# Patient Record
Sex: Female | Born: 1960 | Race: White | Hispanic: No | Marital: Married | Smoking: Former smoker
Health system: Southern US, Community
[De-identification: ages and names within clinical notes are randomized; demographics above are authoritative.]

## PROBLEM LIST (undated history)

## (undated) DIAGNOSIS — K635 Polyp of colon: Secondary | ICD-10-CM

## (undated) DIAGNOSIS — K5090 Crohn's disease, unspecified, without complications: Secondary | ICD-10-CM

## (undated) DIAGNOSIS — D649 Anemia, unspecified: Secondary | ICD-10-CM

## (undated) DIAGNOSIS — K8020 Calculus of gallbladder without cholecystitis without obstruction: Secondary | ICD-10-CM

## (undated) DIAGNOSIS — K529 Noninfective gastroenteritis and colitis, unspecified: Secondary | ICD-10-CM

## (undated) DIAGNOSIS — K56609 Unspecified intestinal obstruction, unspecified as to partial versus complete obstruction: Secondary | ICD-10-CM

## (undated) DIAGNOSIS — S22009A Unspecified fracture of unspecified thoracic vertebra, initial encounter for closed fracture: Secondary | ICD-10-CM

## (undated) DIAGNOSIS — K602 Anal fissure, unspecified: Secondary | ICD-10-CM

## (undated) DIAGNOSIS — N200 Calculus of kidney: Secondary | ICD-10-CM

## (undated) HISTORY — PX: CHOLECYSTECTOMY: SHX55

## (undated) HISTORY — DX: Calculus of kidney: N20.0

## (undated) HISTORY — DX: Crohn's disease, unspecified, without complications: K50.90

## (undated) HISTORY — DX: Anal fissure, unspecified: K60.2

## (undated) HISTORY — PX: LITHOTRIPSY: SUR834

## (undated) HISTORY — DX: Calculus of gallbladder without cholecystitis without obstruction: K80.20

## (undated) HISTORY — DX: Unspecified intestinal obstruction, unspecified as to partial versus complete obstruction: K56.609

## (undated) HISTORY — DX: Polyp of colon: K63.5

## (undated) HISTORY — PX: TONSILLECTOMY: SHX5217

## (undated) HISTORY — PX: SMALL INTESTINE SURGERY: SHX150

## (undated) HISTORY — DX: Noninfective gastroenteritis and colitis, unspecified: K52.9

## (undated) HISTORY — DX: Unspecified fracture of unspecified thoracic vertebra, initial encounter for closed fracture: S22.009A

## (undated) HISTORY — DX: Anemia, unspecified: D64.9

## (undated) HISTORY — PX: OTHER SURGICAL HISTORY: SHX169

---

## 2015-04-26 ENCOUNTER — Encounter: Payer: Self-pay | Admitting: Internal Medicine

## 2015-06-23 ENCOUNTER — Encounter: Payer: Self-pay | Admitting: Internal Medicine

## 2015-06-23 ENCOUNTER — Ambulatory Visit (INDEPENDENT_AMBULATORY_CARE_PROVIDER_SITE_OTHER): Payer: 59 | Admitting: Internal Medicine

## 2015-06-23 VITALS — BP 116/76 | HR 100 | Ht 65.75 in | Wt 204.0 lb

## 2015-06-23 DIAGNOSIS — K50012 Crohn's disease of small intestine with intestinal obstruction: Secondary | ICD-10-CM

## 2015-06-23 DIAGNOSIS — R21 Rash and other nonspecific skin eruption: Secondary | ICD-10-CM | POA: Diagnosis not present

## 2015-06-23 DIAGNOSIS — Z9119 Patient's noncompliance with other medical treatment and regimen: Secondary | ICD-10-CM | POA: Diagnosis not present

## 2015-06-23 DIAGNOSIS — R5382 Chronic fatigue, unspecified: Secondary | ICD-10-CM

## 2015-06-23 DIAGNOSIS — Z91199 Patient's noncompliance with other medical treatment and regimen due to unspecified reason: Secondary | ICD-10-CM

## 2015-06-23 MED ORDER — DIPHENOXYLATE-ATROPINE 2.5-0.025 MG PO TABS: 1.0000 | ORAL_TABLET | Freq: Four times a day (QID) | ORAL | Status: DC | PRN

## 2015-06-23 MED ORDER — PREDNISONE 10 MG PO TABS: 40.0000 mg | ORAL_TABLET | Freq: Every day | ORAL | Status: DC

## 2015-06-23 NOTE — Progress Notes (Signed)
Referred by  Subjective:    Patient ID: Bailey Lewis, female    DOB: December 29, 1960, 55 y.o.   MRN: [id number] Cc: Crohn's disease HPI 55 yo ww says she was dx Crohn's early 46's when in [place] - has had Treatment w/ remicade and Humira, small bowel resection x 1, abscess drainage x 1 and repeated bowel obstruction hospitalizations. Last cared for in [place] in [place] since last year. Not on any maintenance Tx. "When I am sick I am a good patient and take medications but when I feel well I don't" Having terrible diarrhea w/ urge incontinence. Makes work in [employer] office difficult. Does not like work environment otherwise also.  Has some slight rectal bleeding. Has had endoscopic evaluations abut not sure when last was. No outside records here yet.  Says 40# weight increase in last year "healthy foods do not agree with me" - diarrhea Allergies  Allergen Reactions  . Asa [Aspirin] Other (See Comments)    Asthmatic  . Reglan [Metoclopramide]    No outpatient prescriptions prior to visit.   No facility-administered medications prior to visit.   Past Medical History  Diagnosis Date  . Crohn disease (HCC)   . Anal fissure   . Anemia   . Colon polyps   . Gallstones   . Kidney stones   . Bowel obstruction [facility])    Past Surgical History  Procedure Laterality Date  . Cholecystectomy    . Small intestine surgery      with appendectomy  . Tonsillectomy    . Lithotripsy    . Abdominal drain tube placed     Social History   Social History  . Marital Status: Single    Spouse Name: N/A  . Number of Children: 4  . 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 Smoking status: Current Every Day Smoker -- 0.25 packs/day for 40 years    Types: Cigarettes  . Smokeless tobacco: Never Used  . Alcohol Use: 0.0 oz/week    0 Standard drinks or equivalent per week     Comment: occasional wine or beer  . Drug Use: No  . Sexual Activity: Not Asked    Other Topics Concern  . None   Social History Narrative   Divorced, has boyfriend   1 son 3 daughtesr - 1984, 15, 57 and 2000   2 caffeine/day   06/23/15   Family History  Problem Relation Age of Onset  . Colon cancer Maternal Grandfather   . Colon polyps Father   . Celiac disease Cousin   . Crohn's disease Cousin     x 2  . Diabetes Paternal Grandmother   . Diabetes Maternal Grandmother        Review of Systems As above, fatigue, allergy sxs, fingertip skin rash x years All other negative    Objective:   Physical Exam  116/76 mmHg  Pulse 100  Ht 5' 5.75" (1.67 m)  Wt 204 lb (92.534 kg)  BMI 33.18 kg/m2@  General:  Well-developed, well-nourished and in no acute distress Eyes:  anicteric. ENT:   Mouth and posterior pharynx free of lesions.  Neck:   supple w/o thyromegaly or mass.  Lungs: Clear to auscultation bilaterally. Heart:  S1S2, no rubs, murmurs, gallops. Abdomen:  soft,  mildlytender, no hepatosplenomegaly, hernia, or mass and BS+. Low midline transverse and vertical scars and laporoscopic sacers Rectal: Small anal tags NL tone small rectocele nontender  no mass - Robin CMA present Lymph:  no cervical or supraclavicular adenopathy. Extremities:   no edema, cyanosis or clubbing Skin   fingertips erythematous scale Neuro:  A&O x 3.  Psych:  talkaltive ,mild flight of idease and tangential speech.   Data Reviewed: Requesting records       Assessment & Plan:   Encounter Diagnoses  Name Primary?  . Crohn's disease of small intestine with intestinal obstruction (HCC) Yes  . Chronic fatigue   . Rash/skin eruption - figers   . Noncompliance with treatment    Seems like a severe exacerbation of Crohn's presumam bly due to lack of ongoing Tx  Not sure waht rash is ? Extra-intestinal manifestation  Records review from [facility] Prednisone 40 mg qd Lomotil 1-2 q 6 prn CBC, CRP, TSH, CMET, quantiferon, Hep B S Ag, vit D CT abd/pelvis w/  contrast  Further plans pending the above  We did discuss she may need colonoscopy and may need biologic Tx she admits her noncompliance problems but is open to idea of using biologics again - could need Ab testing - consider Cimzia   ? Post-resecetion bile salt malabsorp diarrhea - has taken cholestyramine in past she says

## 2015-06-23 NOTE — Patient Instructions (Addendum)
  Your physician has requested that you go to the basement for the lab work before leaving today.   We have sent the following medications to your pharmacy for you to pick up at your convenience: Prednisone, lomotil (faxed to pharmacy)   Follow up with Dr Carlean Purl on July 5th at    [facility] have been scheduled for a CT scan of the abdomen and pelvis at [facility] ([address]---this is in the same building as Press [facility]).   You are scheduled on 06/30/15/17 at 2:00pm. You should arrive 15 minutes prior to your appointment time for registration. Please follow the written instructions below on the day of your exam:  WARNING: IF YOU ARE ALLERGIC TO IODINE/X-RAY DYE, PLEASE NOTIFY RADIOLOGY IMMEDIATELY AT [phone]! YOU WILL BE GIVEN A 13 HOUR PREMEDICATION PREP.  1) Do not eat or drink anything after 10:00am (4 hours prior to your test) 2) You have been given 2 bottles of oral contrast to drink. The solution may taste   better if refrigerated, but do NOT add ice or any other liquid to this solution. Shake   well before drinking.    Drink 1 bottle of contrast @ 12:00pm (2 hours prior to your exam)  Drink 1 bottle of contrast @ 1:00pm (1 hour prior to your exam)  You may take any medications as prescribed with a small amount of water except for the following: Metformin, Glucophage, Glucovance, Avandamet, Riomet, Fortamet, Actoplus Met, Janumet, Glumetza or Metaglip. The above medications must be held the day of the exam AND 48 hours after the exam.  The purpose of you drinking the oral contrast is to aid in the visualization of your intestinal tract. The contrast solution may cause some diarrhea. Before your exam is started, you will be given a small amount of fluid to drink. Depending on your individual set of symptoms, you may also receive an intravenous injection of x-ray contrast/dye. Plan on being at [facility] for 30 minutes or longer, depending on the type of  exam you are having performed.  This test typically takes 30-45 minutes to complete.  If you have any questions regarding your exam or if you need to reschedule, you may call the CT department at [phone] between the hours of 8:00 am and 5:00 pm, Monday-Friday.  ________________________________________________________________________   I appreciate the opportunity to care for you. Silvano Rusk, MD, [facility]

## 2015-06-24 ENCOUNTER — Encounter: Payer: Self-pay | Admitting: Internal Medicine

## 2015-06-24 ENCOUNTER — Other Ambulatory Visit (INDEPENDENT_AMBULATORY_CARE_PROVIDER_SITE_OTHER): Payer: 59

## 2015-06-24 DIAGNOSIS — R5382 Chronic fatigue, unspecified: Secondary | ICD-10-CM | POA: Diagnosis not present

## 2015-06-24 DIAGNOSIS — K50012 Crohn's disease of small intestine with intestinal obstruction: Secondary | ICD-10-CM | POA: Diagnosis not present

## 2015-06-24 LAB — COMPREHENSIVE METABOLIC PANEL
ALBUMIN: 4.2 g/dL (ref 3.5–5.2)
ALK PHOS: 100 U/L (ref 39–117)
ALT: 24 U/L (ref 0–35)
AST: 22 U/L (ref 0–37)
BILIRUBIN TOTAL: 0.9 mg/dL (ref 0.2–1.2)
BUN: 11 mg/dL (ref 6–23)
CO2: 26 mEq/L (ref 19–32)
Calcium: 9.2 mg/dL (ref 8.4–10.5)
Chloride: 106 mEq/L (ref 96–112)
Creatinine, Ser: 0.72 mg/dL (ref 0.40–1.20)
GFR: 89.13 mL/min (ref >60.00)
GLUCOSE: 90 mg/dL (ref 70–99)
POTASSIUM: 4 meq/L (ref 3.5–5.1)
SODIUM: 141 meq/L (ref 135–145)
TOTAL PROTEIN: 7.1 g/dL (ref 6.0–8.3)

## 2015-06-24 LAB — CBC WITH DIFFERENTIAL/PLATELET
BASOS PCT: 0.6 % (ref 0.0–3.0)
Basophils Absolute: 0 10*3/uL (ref 0.0–0.1)
EOS ABS: 0.3 10*3/uL (ref 0.0–0.7)
EOS PCT: 4.2 % (ref 0.0–5.0)
HEMATOCRIT: 42.5 % (ref 36.0–46.0)
HEMOGLOBIN: 14.7 g/dL (ref 12.0–15.0)
LYMPHS PCT: 45.3 % (ref 12.0–46.0)
Lymphs Abs: 3 10*3/uL (ref 0.7–4.0)
MCHC: 34.6 g/dL (ref 30.0–36.0)
MCV: 90.1 fl (ref 78.0–100.0)
MONOS PCT: 6.8 % (ref 3.0–12.0)
Monocytes Absolute: 0.5 10*3/uL (ref 0.1–1.0)
NEUTROS ABS: 2.9 10*3/uL (ref 1.4–7.7)
Neutrophils Relative %: 43.1 % (ref 43.0–77.0)
PLATELETS: 354 10*3/uL (ref 150.0–400.0)
RBC: 4.72 Mil/uL (ref 3.87–5.11)
RDW: 13 % (ref 11.5–15.5)
WBC: 6.7 10*3/uL (ref 4.0–10.5)

## 2015-06-24 LAB — VITAMIN D 25 HYDROXY (VIT D DEFICIENCY, FRACTURES): VITD: 30.3 ng/mL (ref 30.00–100.00)

## 2015-06-24 LAB — TSH: TSH: 1.52 u[IU]/mL (ref 0.35–4.50)

## 2015-06-24 LAB — C-REACTIVE PROTEIN: CRP: 0.1 mg/dL — ABNORMAL LOW (ref 0.5–20.0)

## 2015-06-24 NOTE — Addendum Note (Signed)
Addended by: Iva Boop on: 06/24/2015 06:43 PM   Modules accepted: Level of Service

## 2015-06-24 NOTE — Progress Notes (Signed)
Quick Note:  Labs all look great Await CT and TB/Hep B studies ______

## 2015-06-25 LAB — HEPATITIS B SURFACE ANTIGEN: HEP B S AG: NEGATIVE

## 2015-06-27 LAB — QUANTIFERON TB GOLD ASSAY (BLOOD)
Interferon Gamma Release Assay: NEGATIVE
Mitogen-Nil: 8.97 IU/mL
Quantiferon Nil Value: 0.05 IU/mL
Quantiferon Tb Ag Minus Nil Value: <0 IU/mL

## 2015-06-30 ENCOUNTER — Ambulatory Visit (INDEPENDENT_AMBULATORY_CARE_PROVIDER_SITE_OTHER)
Admission: RE | Admit: 2015-06-30 | Discharge: 2015-06-30 | Disposition: A | Discharge status: Home / Self Care | Payer: 59 | Source: Ambulatory Visit | Attending: Internal Medicine | Admitting: Internal Medicine

## 2015-06-30 DIAGNOSIS — K50012 Crohn's disease of small intestine with intestinal obstruction: Secondary | ICD-10-CM

## 2015-06-30 MED ORDER — IOPAMIDOL (ISOVUE-300) INJECTION 61%
100.0000 mL | Freq: Once | INTRAVENOUS | Status: AC | PRN
Start: 2015-06-30 — End: 2015-06-30
  Administered 2015-06-30: 100 mL via INTRAVENOUS

## 2015-07-13 ENCOUNTER — Ambulatory Visit (INDEPENDENT_AMBULATORY_CARE_PROVIDER_SITE_OTHER): Payer: 59 | Admitting: Internal Medicine

## 2015-07-13 ENCOUNTER — Encounter: Payer: Self-pay | Admitting: Internal Medicine

## 2015-07-13 VITALS — BP 110/78 | HR 96 | Ht 65.75 in | Wt 204.0 lb

## 2015-07-13 DIAGNOSIS — Z9049 Acquired absence of other specified parts of digestive tract: Secondary | ICD-10-CM

## 2015-07-13 DIAGNOSIS — R197 Diarrhea, unspecified: Secondary | ICD-10-CM | POA: Diagnosis not present

## 2015-07-13 DIAGNOSIS — K5000 Crohn's disease of small intestine without complications: Secondary | ICD-10-CM

## 2015-07-13 NOTE — Progress Notes (Signed)
   Subjective:    Patient ID: Bailey Lewis, female    DOB: 11-19-1960, 55 y.o.   MRN: [id number] Cc: f/u diarrhea, hx Crohn's HPI  She is here w/ boyfriend - did not take prednisone or Lomotil. Less diarrhea and cramps. Labs, CT scan ok. ROI from [facility] no colonoscopy done there though she gave that hx. Prior Lanetta Inch use is reported by her today. Medications, allergies, past medical history, past surgical history, family history and social history are reviewed and updated in the EMR.  Review of Systems As above    Objective:   Physical Exam BP 110/78 mmHg  Pulse 96  Ht 5' 5.75" (1.67 m)  Wt 204 lb (92.534 kg)  BMI 33.18 kg/m2 NAD     Assessment & Plan:   Encounter Diagnoses  Name Primary?  . Crohn's disease of small intestine without complication (HCC) Yes  . Diarrhea, unspecified type   . S/P small bowel resection      So far no good evidence for active Crohn's. She needs a colonoscopy - The risks and benefits as well as alternatives of endoscopic procedure(s) have been discussed and reviewed. All questions answered. The patient agrees to proceed.  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  15 minutes time spent with patient > half in counseling coordination of care

## 2015-07-13 NOTE — Patient Instructions (Signed)

## 2015-07-13 NOTE — Progress Notes (Signed)
Quick Note:  CT scan is ok  1) what are sxs now 2) is she still taking 40 mg prednisone qd and also how much Lomotil use 3) needs colonoscopy re: Crohn's and diarrhea ______

## 2015-07-14 ENCOUNTER — Encounter: Payer: Self-pay | Admitting: Internal Medicine

## 2015-07-15 ENCOUNTER — Encounter: Payer: Self-pay | Admitting: Internal Medicine

## 2015-07-28 ENCOUNTER — Encounter: Payer: 59 | Admitting: Internal Medicine

## 2018-08-09 DIAGNOSIS — K56609 Unspecified intestinal obstruction, unspecified as to partial versus complete obstruction: Secondary | ICD-10-CM | POA: Insufficient documentation

## 2019-07-29 DIAGNOSIS — L409 Psoriasis, unspecified: Secondary | ICD-10-CM | POA: Insufficient documentation

## 2020-06-09 ENCOUNTER — Ambulatory Visit
Admission: RE | Admit: 2020-06-09 | Discharge: 2020-06-09 | Disposition: A | Discharge status: Home / Self Care | Payer: 59 | Source: Ambulatory Visit | Attending: Obstetrics and Gynecology | Admitting: Obstetrics and Gynecology

## 2020-06-09 ENCOUNTER — Other Ambulatory Visit: Payer: Self-pay

## 2020-06-09 ENCOUNTER — Other Ambulatory Visit: Payer: Self-pay | Admitting: Obstetrics and Gynecology

## 2020-06-09 DIAGNOSIS — Z1231 Encounter for screening mammogram for malignant neoplasm of breast: Secondary | ICD-10-CM

## 2020-06-30 DIAGNOSIS — Z6832 Body mass index (BMI) 32.0-32.9, adult: Secondary | ICD-10-CM | POA: Diagnosis not present

## 2020-06-30 DIAGNOSIS — Z01419 Encounter for gynecological examination (general) (routine) without abnormal findings: Secondary | ICD-10-CM | POA: Diagnosis not present

## 2020-07-01 DIAGNOSIS — Z1329 Encounter for screening for other suspected endocrine disorder: Secondary | ICD-10-CM | POA: Diagnosis not present

## 2020-07-01 DIAGNOSIS — Z13228 Encounter for screening for other metabolic disorders: Secondary | ICD-10-CM | POA: Diagnosis not present

## 2020-07-01 DIAGNOSIS — Z13220 Encounter for screening for lipoid disorders: Secondary | ICD-10-CM | POA: Diagnosis not present

## 2020-07-01 DIAGNOSIS — Z1321 Encounter for screening for nutritional disorder: Secondary | ICD-10-CM | POA: Diagnosis not present

## 2020-08-05 ENCOUNTER — Encounter (HOSPITAL_BASED_OUTPATIENT_CLINIC_OR_DEPARTMENT_OTHER): Payer: Self-pay | Admitting: Nurse Practitioner

## 2020-08-05 ENCOUNTER — Other Ambulatory Visit: Payer: Self-pay

## 2020-08-05 ENCOUNTER — Ambulatory Visit (HOSPITAL_BASED_OUTPATIENT_CLINIC_OR_DEPARTMENT_OTHER): Payer: 59 | Admitting: Nurse Practitioner

## 2020-08-05 VITALS — BP 129/86 | HR 86 | Ht 66.0 in | Wt 207.2 lb

## 2020-08-05 DIAGNOSIS — Z87891 Personal history of nicotine dependence: Secondary | ICD-10-CM | POA: Diagnosis not present

## 2020-08-05 DIAGNOSIS — E559 Vitamin D deficiency, unspecified: Secondary | ICD-10-CM | POA: Diagnosis not present

## 2020-08-05 DIAGNOSIS — G4709 Other insomnia: Secondary | ICD-10-CM | POA: Diagnosis not present

## 2020-08-05 DIAGNOSIS — L301 Dyshidrosis [pompholyx]: Secondary | ICD-10-CM | POA: Diagnosis not present

## 2020-08-05 DIAGNOSIS — R4589 Other symptoms and signs involving emotional state: Secondary | ICD-10-CM | POA: Diagnosis not present

## 2020-08-05 DIAGNOSIS — Z1211 Encounter for screening for malignant neoplasm of colon: Secondary | ICD-10-CM | POA: Insufficient documentation

## 2020-08-05 DIAGNOSIS — Z13220 Encounter for screening for lipoid disorders: Secondary | ICD-10-CM | POA: Insufficient documentation

## 2020-08-05 DIAGNOSIS — Z7689 Persons encountering health services in other specified circumstances: Secondary | ICD-10-CM

## 2020-08-05 DIAGNOSIS — R635 Abnormal weight gain: Secondary | ICD-10-CM | POA: Diagnosis not present

## 2020-08-05 DIAGNOSIS — K50919 Crohn's disease, unspecified, with unspecified complications: Secondary | ICD-10-CM

## 2020-08-05 MED ORDER — HYDROXYZINE HCL 50 MG PO TABS: 50.0000 mg | ORAL_TABLET | Freq: Every evening | ORAL | 3 refills | Status: DC | PRN

## 2020-08-05 NOTE — Assessment & Plan Note (Signed)
No red flags today Low dose lung CT ordered based on recommendations

## 2020-08-05 NOTE — Assessment & Plan Note (Signed)
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  Recommend trial of hydroxyzine as needed at night for anxiety and sleep May consider SSRI for mood if this is helpful, but not successful in complete control Counseling may be beneficial to help with both sleep, anxiety/depression symptoms, and emotional eating.

## 2020-08-05 NOTE — Assessment & Plan Note (Signed)
Lesions present bilaterally on palms of hands.  No pustules present at this time Patient reports this is well controlled at this time with PRN steroid creams No signs of infection or progression Will monitor

## 2020-08-05 NOTE — Patient Instructions (Addendum)
Recommendations from today's visit: Try the hydroxyzine for sleep I strongly recommend trying journaling your thoughts and progressive muscle relaxation (you can find this on youtube) before bedtime to help with relaxation.  I would like you to work on conscious eating habits and allowing yourself small portions of treats, but only small amounts.  If you find yourself mindlessly snacking, you can provide yourself with a "serving size" in a bowl and once that is gone then stop.  We can always look into medication to help with weight loss if you need it, but right now I think you have a great motivation and are willing to try other things so lets go for it! I recommend taking Vitamin D3 supplement of 1000iU daily to help with your low Vitamin D levels  Information on diet, exercise, and health maintenance recommendations are listed below. This is information to help you be sure you are on track for optimal health and monitoring.    Please look over this and let us know if you have any questions or if you have completed any of the health maintenance outside of [facility] so that we can be sure your records are up to date.  ___________________________________________________________  Thank you for choosing [facility] for your Primary Care needs. I am excited for the opportunity to partner with you to meet your health care goals. It was a pleasure meeting you today!  I am an Adult-Geriatric Nurse Practitioner with a background in caring for patients for more than 20 years. I provide primary care and sports medicine services to patients age 40 and older within this office. I am also the director of the APP Fellowship with [facility].   I am passionate about providing the best service to you through preventive medicine and supportive care. I consider you a part of the medical team and value your input. I work diligently to ensure that you are heard and your needs are  met in a safe and effective manner. I want you to feel comfortable with me as your provider and want you to know that your health concerns are important to me.  For your information, our office hours are Monday- Friday 8:00 AM - 5:00 PM At this time I am not in the office on Wednesdays.  If you have questions or concerns, please call our office at [phone] or send [place] a MyChart message and we will respond as quickly as possible.   For all urgent or time sensitive needs we ask that you please call the office to avoid delays. MyChart is not constantly monitored and replies may take up to 72 business hours.  MyChart Policy: MyChart allows for you to see your visit notes, after visit summary, provider recommendations, lab and tests results, make an appointment, request refills, and contact your provider or the office for non-urgent questions or concerns. Providers are seeing patients during normal business hours and do not have built in time to review MyChart messages.  We ask that you allow a minimum of 4 business days for responses to [employer]. For this reason, please do not send urgent requests through [place]. Please call the office at [phone]. Complex MyChart concerns may require a visit. Your provider may request you schedule a virtual or in person visit to ensure we are providing the best care possible. MyChart messages sent after 4:00 PM on Friday will not be received by the provider until Monday morning.    Lab and Test  Results: You will receive your lab and test results on MyChart as soon as they are completed and results have been sent by the lab or testing facility. Due to this service, you will receive your results BEFORE your provider.  I review lab and tests results each morning prior to seeing patients. Some results require collaboration with other providers to ensure you are receiving the most appropriate care. For this reason, we ask that you please allow a minimum of 4  business days for your provider to receive and review lab and test results and contact you about these.  Most lab and test result comments from the provider will be sent through [place]. Your provider may recommend changes to the plan of care, follow-up visits, repeat testing, ask questions, or request an office visit to discuss these results. You may reply directly to this message or call the office at [phone] to provide information for the provider or set up an appointment. In some instances, you will be called with test results and recommendations. Please let us know if this is preferred and we will make note of this in your chart to provide this for you.    If you have not heard a response to your lab or test results in 72 business hours, please call the office to let us know.   After Hours: For all non-emergency after hours needs, please call the office at [phone] and select the option to reach the on-call provider service. On-call services are shared between multiple [place] offices and therefore it will not be possible to speak directly with your provider. On-call providers may provide medical advice and recommendations, but are unable to provide refills for maintenance medications.  For all emergency or urgent medical needs after normal business hours, we recommend that you seek care at the closest Urgent Care or Emergency Department to ensure appropriate treatment in a timely manner.  [facility] has a 24 hour emergency room located on the ground floor for your convenience.    Please do not hesitate to reach out to [place] with concerns.   Thank you, again, for choosing me as your health care partner. I appreciate your trust and look forward to learning more about you.   Worthy Keeler, DNP, AGNP-c ___________________________________________________________  Health Maintenance Recommendations Screening Testing Mammogram Every 1 -2 years based on history  and risk factors Starting at age 41 Pap Smear Ages 21-39 every 3 years Ages 89-65 every 5 years with HPV testing More frequent testing may be required based on results and history Colon Cancer Screening Every 1-10 years based on test performed, risk factors, and history Starting at age 73 Bone Density Screening Every 2-10 years based on history Starting at age 63 for women Recommendations for men differ based on medication usage, history, and risk factors AAA Screening One time ultrasound Men 51-28 years old who have every smoked Lung Cancer Screening Low Dose Lung CT every 12 months Age 66-80 years with a 30 pack-year smoking history who still smoke or who have quit within the last 15 years  Screening Labs Routine  Labs: Complete Blood Count (CBC), Complete Metabolic Panel (CMP), Cholesterol (Lipid Panel) Every 6-12 months based on history and medications May be recommended more frequently based on current conditions or previous results Hemoglobin A1c Lab Every 3-12 months based on history and previous results Starting at age 98 or earlier with diagnosis of diabetes, high cholesterol, BMI >26, and/or risk factors Frequent monitoring for patients with  diabetes to ensure blood sugar control Thyroid Panel (TSH w/ T3 & T4) Every 6 months based on history, symptoms, and risk factors May be repeated more often if on medication HIV One time testing for all patients 78 and older May be repeated more frequently for patients with increased risk factors or exposure Hepatitis C One time testing for all patients 7 and older May be repeated more frequently for patients with increased risk factors or exposure Gonorrhea, Chlamydia Every 12 months for all sexually active persons 13-24 years Additional monitoring may be recommended for those who are considered high risk or who have symptoms PSA Men 85-78 years old with risk factors Additional screening may be recommended from age 54-69  based on risk factors, symptoms, and history  Vaccine Recommendations Tetanus Booster All adults every 10 years Flu Vaccine All patients 6 months and older every year COVID Vaccine All patients 12 years and older Initial dosing with booster May recommend additional booster based on age and health history HPV Vaccine 2 doses all patients age 71-26 Dosing may be considered for patients over 26 Shingles Vaccine (Shingrix) 2 doses all adults 6 years and older Pneumonia (Pneumovax 23) All adults 50 years and older May recommend earlier dosing based on health history Pneumonia (Prevnar 10) All adults 58 years and older Dosed 1 year after Pneumovax 23  Additional Screening, Testing, and Vaccinations may be recommended on an individualized basis based on family history, health history, risk factors, and/or exposure.  __________________________________________________________  Diet Recommendations for All Patients  I recommend that all patients maintain a diet low in saturated fats, carbohydrates, and cholesterol. While this can be challenging at first, it is not impossible and small changes can make big differences.  Things to try: Decreasing the amount of soda, sweet tea, and/or juice to one or less per day and replace with water While water is always the first choice, if you do not like water you may consider adding a water additive without sugar to improve the taste other sugar free drinks Replace potatoes with a brightly colored vegetable at dinner Use healthy oils, such as canola oil or olive oil, instead of butter or hard margarine Limit your bread intake to two pieces or less a day Replace regular pasta with low carb pasta options Bake, broil, or grill foods instead of frying Monitor portion sizes  Eat smaller, more frequent meals throughout the day instead of large meals  An important thing to remember is, if you love foods that are not great for your health, you don't have  to give them up completely. Instead, allow these foods to be a reward when you have done well. Allowing yourself to still have special treats every once in a while is a nice way to tell yourself thank you for working hard to keep yourself healthy.   Also remember that every day is a new day. If you have a bad day and "fall off the wagon", you can still climb right back up and keep moving along on your journey!  We have resources available to help you!  Some websites that may be helpful include: www.http://carter.biz/  Www.VeryWellFit.com _____________________________________________________________  Activity Recommendations for All Patients  I recommend that all adults get at least 20 minutes of moderate physical activity that elevates your heart rate at least 5 days out of the week.  Some examples include: Walking or jogging at a pace that allows you to carry on a conversation Cycling (stationary bike or outdoors) Water aerobics  Yoga Weight lifting Dancing If physical limitations prevent you from putting stress on your joints, exercise in a pool or seated in a chair are excellent options.  Do determine your MAXIMUM heart rate for activity: YOUR AGE - 220 = MAX HeartRate   Remember! Do not push yourself too hard.  Start slowly and build up your pace, speed, weight, time in exercise, etc.  Allow your body to rest between exercise and get good sleep. You will need more water than normal when you are exerting yourself. Do not wait until you are thirsty to drink. Drink with a purpose of getting in at least 8, 8 ounce glasses of water a day plus more depending on how much you exercise and sweat.    If you begin to develop dizziness, chest pain, abdominal pain, jaw pain, shortness of breath, headache, vision changes, lightheadedness, or other concerning symptoms, stop the activity and allow your body to rest. If your symptoms are severe, seek emergency evaluation immediately. If your symptoms are  concerning, but not severe, please let us know so that we can recommend further evaluation.   ________________________________________________________________

## 2020-08-05 NOTE — Assessment & Plan Note (Signed)
Emotional eating and limited physical activity with increased weight gain Discussion on ways to limit emotional response eating and train to monitor portion sizes without limiting specific elements from the diet that provide pleasure.  Recommend walking 20 minutes a day on lunch break within the building to avoid being in the heat Monitor portion sizes of meals Pack daily allowance of "treats" to prevent over consumption Avoid leaving snacks and treats out for general grabbing - consider portion packing snacks with daily allotment based on serving size and calories.  Recommend 1500 calorie diet with no more than 200g of carbohydrates per day Increase water and fiber intake to fill stomach  Consider Wegovy for weight loss once this is available again.

## 2020-08-05 NOTE — Assessment & Plan Note (Signed)
Recommend Vitamin D3 1000iU daily to help prevent bone loss Labs reviewed from GYN in care everywhere Will plan to recheck labs in 6 months to determine if levels restored.

## 2020-08-05 NOTE — Assessment & Plan Note (Signed)
Hx of Crohns without recent follow-up No red flags present today Flairs appear to be under control, but recommend routine management to avoid complications Referral to GI placed today

## 2020-08-05 NOTE — Assessment & Plan Note (Signed)
Review of current and past medical history, social history, medication, and family history.  Review of care gaps and health maintenance recommendations.  Records from recent providers to be requested if not available in Chart Review or Care Everywhere.  Recommendations for health maintenance, diet, and exercise provided.  Review of labs from GYN with patient today F/U for CPE in 6 months

## 2020-08-05 NOTE — Progress Notes (Signed)
Shawna Clamp, DNP, AGNP-c Primary Care Services ______________________________________________________________________  HPI Bailey Lewis is a 60 y.o. female presenting to [facility] today to establish care.   Patient Care Team: Neiman Roots, Sung Amabile, NP as PCP - General (Nurse Practitioner)  Health Maintenance  Topic Date Due   Pneumococcal Vaccination (1 - PCV) Never done   HIV Screening  Never done   Hepatitis C Screening: USPSTF Recommendation to screen - Ages 60-79 yo.  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[place]- loves her job and co-workers Recent OB/GYN visit with labs  UTD on pap and mammogram Due for colonoscopy  Lung Ca Screening Former smoker most of adult life with periods of light to moderate smoking Quit about 3 years ago No CP, weakness, ShOB, weight loss, night sweats Interested in screening for lung ca given her smoking history Emotional Eating Endorses utilizing food for emotional response Eating when happy, stressed, sad, bored Reports that she will occasionally eat without even realizing Recognizes this as a problem, but not sure how to manage Has gained about 40 pounds in the last few years and wants to work to get this off Not sure where to begin Anxiety Endorses racing thoughts that keep her from falling asleep some nights Reports that she is unable to stop her thoughts  Has previously been on [place], which led to sleep walking incidents with ordering food, cooking, and eating Was on xanax at one point, but this was not  healthy for her- has a family history of substance abuse and is not comfortable with this kind of medication Crohn's Disease Hx of crohns- not currently on medications Most recent flair appx two years ago in the form of blockage requiring long hospitalization Feels that diet factors can exacerbate mild issues Would like referral to GI for evaluation   Patient Active Problem List   Diagnosis Date Noted   Dyshidrotic eczema 08/05/2020   Emotional sensitivity 08/05/2020   Other insomnia 08/05/2020   Crohn's disease with complication (HCC) 08/05/2020   Weight gain 08/05/2020   Former smoker 08/05/2020   Encounter to establish care 08/05/2020   Vitamin D deficiency 08/05/2020   Psoriasis 07/29/2019   SBO (small bowel obstruction) (HCC) 08/09/2018    PHQ9 Today: Depression screen PHQ 2/9 08/05/2020  Decreased Interest 0  Down, Depressed, Hopeless 0  PHQ - 2 Score 0  Altered sleeping 2  Tired, decreased energy 1  Change in appetite 2  Feeling bad or failure about yourself  0  Trouble concentrating 0  Moving slowly or fidgety/restless 0  Suicidal thoughts 0  PHQ-9 Score 5   GAD7 Today: GAD 7 : Generalized Anxiety Score 08/05/2020  Nervous, Anxious, on Edge 0  Control/stop worrying 0  Worry too much - different things 0  Trouble relaxing 0  Restless 0  Easily annoyed or irritable 0  Afraid - awful might happen 0  Total GAD 7 Score 0   ______________________________________________________________________ PMH Past Medical History:  Diagnosis Date   Anal  fissure    Anemia    Bowel obstruction (HCC)    Colon polyps    Crohn disease (HCC)    Enteritis    small and large intestine   Gallstones    Kidney stones    Renal calculus, left    nonobstructive    ROS All review of systems negative except what is listed in the HPI  PHYSICAL EXAM Physical Exam Vitals and nursing note reviewed.  Constitutional:      Appearance: Normal appearance. She is obese.  HENT:      Head: Normocephalic and atraumatic.  Eyes:     Extraocular Movements: Extraocular movements intact.     Conjunctiva/sclera: Conjunctivae normal.     Pupils: Pupils are equal, round, and reactive to light.  Neck:     Vascular: No carotid bruit.  Cardiovascular:     Rate and Rhythm: Normal rate and regular rhythm.     Pulses: Normal pulses.     Heart sounds: Normal heart sounds.  Pulmonary:     Effort: Pulmonary effort is normal.     Breath sounds: Normal breath sounds.  Abdominal:     General: Abdomen is flat. Bowel sounds are normal. There is no distension.     Palpations: Abdomen is soft.     Tenderness: There is no abdominal tenderness. There is no guarding.  Musculoskeletal:        General: Normal range of motion.     Cervical back: Normal range of motion. No tenderness.     Right lower leg: No edema.     Left lower leg: No edema.  Lymphadenopathy:     Cervical: No cervical adenopathy.  Skin:    General: Skin is warm and dry.     Capillary Refill: Capillary refill takes less than 2 seconds.  Neurological:     General: No focal deficit present.     Mental Status: She is alert and oriented to person, place, and time.     Cranial Nerves: No cranial nerve deficit.     Motor: No weakness.     Gait: Gait normal.  Psychiatric:        Mood and Affect: Mood normal.        Behavior: Behavior normal.        Thought Content: Thought content normal.        Judgment: Judgment normal.   ______________________________________________________________________ ASSESSMENT AND PLAN Problem List Items Addressed This Visit     Dyshidrotic eczema    Lesions present bilaterally on palms of hands.  No pustules present at this time Patient reports this is well controlled at this time with PRN steroid creams No signs of infection or progression Will monitor       Emotional sensitivity - Primary    Emotionally sensitive to her surroundings and people creating anxiety and depressive  symptoms that interrupt sleep and cause intrusive thoughts and emotional eating Recommendations provided for journaling and progressive muscle relaxation at bedtime to help with thoughts and anxiety Hydroxyzine as needed for sleep Recommendations for increased exercise and ways to help prevent over-eating/snacking with portion control and avoiding full packages when consuming foods and snack May benefit from SSRI for improved symptom management with counseling if unsuccessful on own F/U if sx worsen or fail to improve.        Relevant Medications   hydrOXYzine (ATARAX/VISTARIL) 50 MG tablet   Other insomnia    Insomnia related to thoughts and worry Has not done well in the past  with sleep/anxiety medications Recommend progressive muscle relaxation, three good things and "worry list" journaling before bedtime to rid mind of intrusive thoughts and concerns.  Recommend trial of hydroxyzine as needed at night for anxiety and sleep May consider SSRI for mood if this is helpful, but not successful in complete control Counseling may be beneficial to help with both sleep, anxiety/depression symptoms, and emotional eating.         Relevant Medications   hydrOXYzine (ATARAX/VISTARIL) 50 MG tablet   Crohn's disease with complication (HCC)    Hx of Crohns without recent follow-up No red flags present today Flairs appear to be under control, but recommend routine management to avoid complications Referral to GI placed today       Relevant Orders   Ambulatory referral to Gastroenterology   Weight gain    Emotional eating and limited physical activity with increased weight gain Discussion on ways to limit emotional response eating and train to monitor portion sizes without limiting specific elements from the diet that provide pleasure.  Recommend walking 20 minutes a day on lunch break within the building to avoid being in the heat Monitor portion sizes of meals Pack daily allowance of  "treats" to prevent over consumption Avoid leaving snacks and treats out for general grabbing - consider portion packing snacks with daily allotment based on serving size and calories.  Recommend 1500 calorie diet with no more than 200g of carbohydrates per day Increase water and fiber intake to fill stomach  Consider Wegovy for weight loss once this is available again.        Former smoker    No red flags today Low dose lung CT ordered based on recommendations        Relevant Orders   CT CHEST LUNG CA SCREEN LOW DOSE W/O CM   Encounter to establish care    Review of current and past medical history, social history, medication, and family history.  Review of care gaps and health maintenance recommendations.  Records from recent providers to be requested if not available in Chart Review or Care Everywhere.  Recommendations for health maintenance, diet, and exercise provided.  Review of labs from GYN with patient today F/U for CPE in 6 months        Vitamin D deficiency    Recommend Vitamin D3 1000iU daily to help prevent bone loss Labs reviewed from GYN in care everywhere Will plan to recheck labs in 6 months to determine if levels restored.         Education provided today during visit and on AVS for patient to review at home.  Diet and Exercise recommendations provided.  Current diagnoses and recommendations discussed. HM recommendations reviewed with recommendations.    Outpatient Encounter Medications as of 08/05/2020  Medication Sig   clobetasol ointment (TEMOVATE) 0.05 % Apply 2 times a day to affected areas.  Stop when smooth. Do not apply to face or skin folds.   hydrOXYzine (ATARAX/VISTARIL) 50 MG tablet Take 1 tablet (50 mg total) by mouth at bedtime and may repeat dose one time if needed. For sleep   [DISCONTINUED] cholestyramine (QUESTRAN) 4 g packet Take by mouth.   [DISCONTINUED] Crisaborole (EUCRISA) 2 % OINT Apply topically.   [DISCONTINUED] folic acid  (FOLVITE) 1 MG tablet Take by mouth.   [DISCONTINUED] methotrexate (RHEUMATREX) 2.5 MG tablet Take 7.5 mg (3 tablets) by mouth today (05/15/19).  THEN, take 15 mg (6 tablets) by mouth once a week, starting on 05/22/19.   [DISCONTINUED]  pantoprazole (PROTONIX) 40 MG tablet Take by mouth.   betamethasone valerate (VALISONE) 0.1 % cream Apply 0.1 % topically.   [DISCONTINUED] Cholecalciferol 1.25 MG (50000 UT) capsule cholecalciferol (vitamin D3) 1,250 mcg (50,000 unit) capsule  Take 1 capsule every week by oral route.   [DISCONTINUED] naproxen sodium (ALEVE) 220 MG tablet Take by mouth.   No facility-administered encounter medications on file as of 08/05/2020.    Return in about 6 months (around 02/05/2021) for CPE and weight f/u.  Time: 65 minutes, >50% spent counseling, care coordination, chart review, and documentation.   Tollie Eth, DNP, AGNP-c

## 2020-08-05 NOTE — Assessment & Plan Note (Signed)
Emotionally sensitive to her surroundings and people creating anxiety and depressive symptoms that interrupt sleep and cause intrusive thoughts and emotional eating Recommendations provided for journaling and progressive muscle relaxation at bedtime to help with thoughts and anxiety Hydroxyzine as needed for sleep Recommendations for increased exercise and ways to help prevent over-eating/snacking with portion control and avoiding full packages when consuming foods and snack May benefit from SSRI for improved symptom management with counseling if unsuccessful on own F/U if sx worsen or fail to improve.

## 2020-08-26 ENCOUNTER — Ambulatory Visit: Payer: 59

## 2020-08-26 DIAGNOSIS — H2513 Age-related nuclear cataract, bilateral: Secondary | ICD-10-CM | POA: Diagnosis not present

## 2020-09-09 ENCOUNTER — Ambulatory Visit
Admission: RE | Admit: 2020-09-09 | Discharge: 2020-09-09 | Disposition: A | Discharge status: Home / Self Care | Payer: 59 | Source: Ambulatory Visit | Attending: Nurse Practitioner | Admitting: Nurse Practitioner

## 2020-09-09 ENCOUNTER — Other Ambulatory Visit: Payer: Self-pay

## 2020-09-09 DIAGNOSIS — Z87891 Personal history of nicotine dependence: Secondary | ICD-10-CM | POA: Diagnosis not present

## 2020-09-30 ENCOUNTER — Telehealth (HOSPITAL_BASED_OUTPATIENT_CLINIC_OR_DEPARTMENT_OTHER): Payer: Self-pay

## 2020-09-30 NOTE — Telephone Encounter (Signed)
Results reviewed by patient via MyChart.  Seen on 09/22/2020  7:02 AM Instructed patient to contact the office with any questions or concerns.

## 2020-09-30 NOTE — Telephone Encounter (Signed)
-----   Message from Tollie Eth, NP sent at 09/22/2020  6:48 AM EDT ----- Bailey Lewis,   The lung cancer screening showed no concerning signs for cancer at this time. They recommend we continue the screening once a year to make sure there are no changes.  There was evidence of calcification in the coronary arteries, which can result from elevated cholesterol. This can lead to increased risk of heart attack and stroke. I recommend that we get some labs to monitor your cholesterol levels and determine the need to begin a treatment level for this. In the meantime, I recommend decreasing your saturated fats (fats solid at room temperature) and walking 20 minutes a day to help with your cholesterol and overall cardiovascular health.   If you are OK with labs, we can plan on an annual physical in the next few months and get labs at that time. You can call the office to schedule this at [phone].  SaraBeth

## 2021-04-26 ENCOUNTER — Telehealth (HOSPITAL_BASED_OUTPATIENT_CLINIC_OR_DEPARTMENT_OTHER): Payer: Self-pay | Admitting: Nurse Practitioner

## 2021-04-26 NOTE — Telephone Encounter (Signed)
Called pt on 4/19 to confirm information for an appt scheduled on MyChart. Inquired about pt's last physical because a previous one wasn't shown in her chart. Pt questioned why the information was needed, and seemed confused when explained to her. Informed pt that usually only one physical is necessary in a year and that her ins might not cover it if she's not due. Reiterated this about two times and the pt insisted that she would like to get the service done at her gynecologist. Suggested that the pt still check to see when her last one was so insurance will cover it. Pt instructed me to cancel scheduled appt and said that she wouldn't be coming back to this practice. ?

## 2021-05-29 ENCOUNTER — Encounter (HOSPITAL_BASED_OUTPATIENT_CLINIC_OR_DEPARTMENT_OTHER): Payer: 59 | Admitting: Nurse Practitioner

## 2021-06-12 ENCOUNTER — Other Ambulatory Visit (HOSPITAL_COMMUNITY): Payer: Self-pay

## 2021-06-12 ENCOUNTER — Telehealth: Payer: 59 | Admitting: Physician Assistant

## 2021-06-12 DIAGNOSIS — J019 Acute sinusitis, unspecified: Secondary | ICD-10-CM | POA: Diagnosis not present

## 2021-06-12 DIAGNOSIS — B9689 Other specified bacterial agents as the cause of diseases classified elsewhere: Secondary | ICD-10-CM

## 2021-06-12 MED ORDER — AMOXICILLIN-POT CLAVULANATE 875-125 MG PO TABS
1.0000 | ORAL_TABLET | Freq: Two times a day (BID) | ORAL | 0 refills | Status: DC
  Filled 2021-06-12: qty 14, 7d supply, fill #0

## 2021-06-12 NOTE — Progress Notes (Signed)
Virtual Visit Consent   Bailey Lewis, you are scheduled for a virtual visit with a Center provider today. Just as with appointments in the office, your consent must be obtained to participate. Your consent will be active for this visit and any virtual visit you may have with one of our providers in the next 365 days. If you have a MyChart account, a copy of this consent can be sent to you electronically.  As this is a virtual visit, video technology does not allow for your provider to perform a traditional examination. This may limit your provider's ability to fully assess your condition. If your provider identifies any concerns that need to be evaluated in person or the need to arrange testing (such as labs, EKG, etc.), we will make arrangements to do so. Although advances in technology are sophisticated, we cannot ensure that it will always work on either your end or our end. If the connection with a video visit is poor, the visit may have to be switched to a telephone visit. With either a video or telephone visit, we are not always able to ensure that we have a secure connection.  By engaging in this virtual visit, you consent to the provision of healthcare and authorize for your insurance to be billed (if applicable) for the services provided during this visit. Depending on your insurance coverage, you may receive a charge related to this service.  I need to obtain your verbal consent now. Are you willing to proceed with your visit today? Bailey Lewis has provided verbal consent on 06/12/2021 for a virtual visit (video or telephone). [name], PA-C  Date: 06/12/2021 1:07 PM  Virtual Visit via Video Note   I, [name], connected with  Bailey Lewis  ([id number], May 09, 1960) on 06/12/21 at  1:00 PM EDT by a video-enabled telemedicine application and verified that I am speaking with the correct person using two identifiers.  Location: Patient: Virtual Visit Location  Patient: Other: work; isolated Provider: Engineer, mining Provider: Home Office   I discussed the limitations of evaluation and management by telemedicine and the availability of in person appointments. The patient expressed understanding and agreed to proceed.    History of Present Illness: Bailey Lewis is a 61 y.o. who identifies as a female who was assigned female at birth, and is being seen today for possible sinus infection.  HPI: Sinusitis This is a new problem. The current episode started 1 to 4 weeks ago. The problem has been gradually worsening since onset. Maximum temperature: subjective. Associated symptoms include chills (started yesterday), congestion, coughing (intermittent), ear pain (right), headaches, sinus pressure (right side is worse) and a sore throat (starting now). Pertinent negatives include no hoarse voice. Treatments tried: aleve. The treatment provided no relief.     Problems:  Patient Active Problem List   Diagnosis Date Noted   Dyshidrotic eczema 08/05/2020   Emotional sensitivity 08/05/2020   Other insomnia 08/05/2020   Crohn's disease with complication (HCC) 08/05/2020   Weight gain 08/05/2020   Former smoker 08/05/2020   Encounter to establish care 08/05/2020   Vitamin D deficiency 08/05/2020   Psoriasis 07/29/2019   SBO (small bowel obstruction) (HCC) 08/09/2018    Allergies:  Allergies  Allergen Reactions   Asa [Aspirin] Other (See Comments)    Asthmatic   Reglan [Metoclopramide]    Medications:  Current Outpatient Medications:    amoxicillin-clavulanate (AUGMENTIN) 875-125 MG tablet, Take 1 tablet by mouth 2 (two) times daily., Disp: 14  tablet, Rfl: 0   betamethasone valerate (VALISONE) 0.1 % cream, Apply 0.1 % topically., Disp: , Rfl:    clobetasol ointment (TEMOVATE) 0.05 %, Apply 2 times a day to affected areas.  Stop when smooth. Do not apply to face or skin folds., Disp: , Rfl:    hydrOXYzine (ATARAX/VISTARIL) 50 MG tablet, Take 1  tablet (50 mg total) by mouth at bedtime and may repeat dose one time if needed. For sleep, Disp: 60 tablet, Rfl: 3  Observations/Objective: Patient is well-developed, well-nourished in no acute distress.  Resting comfortably  Head is normocephalic, atraumatic.  No labored breathing.  Speech is clear and coherent with logical content.  Patient is alert and oriented at baseline.    Assessment and Plan: 1. Acute bacterial sinusitis - amoxicillin-clavulanate (AUGMENTIN) 875-125 MG tablet; Take 1 tablet by mouth 2 (two) times daily.  Dispense: 14 tablet; Refill: 0  - Worsening symptoms that have not responded to OTC medications.  - Will give Augmentin - Continue allergy medications.  - Steam and humidifier can help - Stay well hydrated and get plenty of rest.  - Seek in person evaluation if no symptom improvement or if symptoms worsen   Follow Up Instructions: I discussed the assessment and treatment plan with the patient. The patient was provided an opportunity to ask questions and all were answered. The patient agreed with the plan and demonstrated an understanding of the instructions.  A copy of instructions were sent to the patient via MyChart unless otherwise noted below.    The patient was advised to call back or seek an in-person evaluation if the symptoms worsen or if the condition fails to improve as anticipated.  Time:  I spent 8 minutes with the patient via telehealth technology discussing the above problems/concerns.    [name], PA-C

## 2021-06-12 NOTE — Patient Instructions (Signed)
Bailey Lewis, thank you for joining [name], PA-C for today's virtual visit.  While this provider is not your primary care provider (PCP), if your PCP is located in our provider database this encounter information will be shared with them immediately following your visit.  Consent: (Patient) Bailey Lewis provided verbal consent for this virtual visit at the beginning of the encounter.  Current Medications:  Current Outpatient Medications:    amoxicillin-clavulanate (AUGMENTIN) 875-125 MG tablet, Take 1 tablet by mouth 2 (two) times daily., Disp: 14 tablet, Rfl: 0   betamethasone valerate (VALISONE) 0.1 % cream, Apply 0.1 % topically., Disp: , Rfl:    clobetasol ointment (TEMOVATE) 0.05 %, Apply 2 times a day to affected areas.  Stop when smooth. Do not apply to face or skin folds., Disp: , Rfl:    hydrOXYzine (ATARAX/VISTARIL) 50 MG tablet, Take 1 tablet (50 mg total) by mouth at bedtime and may repeat dose one time if needed. For sleep, Disp: 60 tablet, Rfl: 3   Medications ordered in this encounter:  Meds ordered this encounter  Medications   amoxicillin-clavulanate (AUGMENTIN) 875-125 MG tablet    Sig: Take 1 tablet by mouth 2 (two) times daily.    Dispense:  14 tablet    Refill:  0    Order Specific Question:   Supervising Provider    Answer:   Hyacinth Meeker, BRIAN [[id number]]     *If you need refills on other medications prior to your next appointment, please contact your pharmacy*  Follow-Up: Call back or seek an in-person evaluation if the symptoms worsen or if the condition fails to improve as anticipated.  Other Instructions Sinus Infection, Adult A sinus infection, also called sinusitis, is inflammation of your sinuses. Sinuses are hollow spaces in the bones around your face. Your sinuses are located: Around your eyes. In the middle of your forehead. Behind your nose. In your cheekbones. Mucus normally drains out of your sinuses. When your nasal tissues become  inflamed or swollen, mucus can become trapped or blocked. This allows bacteria, viruses, and fungi to grow, which leads to infection. Most infections of the sinuses are caused by a virus. A sinus infection can develop quickly. It can last for up to 4 weeks (acute) or for more than 12 weeks (chronic). A sinus infection often develops after a cold. What are the causes? This condition is caused by anything that creates swelling in the sinuses or stops mucus from draining. This includes: Allergies. Asthma. Infection from bacteria or viruses. Deformities or blockages in your nose or sinuses. Abnormal growths in the nose (nasal polyps). Pollutants, such as chemicals or irritants in the air. Infection from fungi. This is rare. What increases the risk? You are more likely to develop this condition if you: Have a weak body defense system (immune system). Do a lot of swimming or diving. Overuse nasal sprays. Smoke. What are the signs or symptoms? The main symptoms of this condition are pain and a feeling of pressure around the affected sinuses. Other symptoms include: Stuffy nose or congestion that makes it difficult to breathe through your nose. Thick yellow or greenish drainage from your nose. Tenderness, swelling, and warmth over the affected sinuses. A cough that may get worse at night. Decreased sense of smell and taste. Extra mucus that collects in the throat or the back of the nose (postnasal drip) causing a sore throat or bad breath. Tiredness (fatigue). Fever. How is this diagnosed? This condition is diagnosed based on: Your symptoms. Your  medical history. A physical exam. Tests to find out if your condition is acute or chronic. This may include: Checking your nose for nasal polyps. Viewing your sinuses using a device that has a light (endoscope). Testing for allergies or bacteria. Imaging tests, such as an MRI or CT scan. In rare cases, a bone biopsy may be done to rule out more  serious types of fungal sinus disease. How is this treated? Treatment for a sinus infection depends on the cause and whether your condition is chronic or acute. If caused by a virus, your symptoms should go away on their own within 10 days. You may be given medicines to relieve symptoms. They include: Medicines that shrink swollen nasal passages (decongestants). A spray that eases inflammation of the nostrils (topical intranasal corticosteroids). Rinses that help get rid of thick mucus in your nose (nasal saline washes). Medicines that treat allergies (antihistamines). Over-the-counter pain relievers. If caused by bacteria, your health care provider may recommend waiting to see if your symptoms improve. Most bacterial infections will get better without antibiotic medicine. You may be given antibiotics if you have: A severe infection. A weak immune system. If caused by narrow nasal passages or nasal polyps, surgery may be needed. Follow these instructions at home: Medicines Take, use, or apply over-the-counter and prescription medicines only as told by your health care provider. These may include nasal sprays. If you were prescribed an antibiotic medicine, take it as told by your health care provider. Do not stop taking the antibiotic even if you start to feel better. Hydrate and humidify  Drink enough fluid to keep your urine pale yellow. Staying hydrated will help to thin your mucus. Use a cool mist humidifier to keep the humidity level in your home above 50%. Inhale steam for 10-15 minutes, 3-4 times a day, or as told by your health care provider. You can do this in the bathroom while a hot shower is running. Limit your exposure to cool or dry air. Rest Rest as much as possible. Sleep with your head raised (elevated). Make sure you get enough sleep each night. General instructions  Apply a warm, moist washcloth to your face 3-4 times a day or as told by your health care provider. This  will help with discomfort. Use nasal saline washes as often as told by your health care provider. Wash your hands often with soap and water to reduce your exposure to germs. If soap and water are not available, use hand sanitizer. Do not smoke. Avoid being around people who are smoking (secondhand smoke). Keep all follow-up visits. This is important. Contact a health care provider if: You have a fever. Your symptoms get worse. Your symptoms do not improve within 10 days. Get help right away if: You have a severe headache. You have persistent vomiting. You have severe pain or swelling around your face or eyes. You have vision problems. You develop confusion. Your neck is stiff. You have trouble breathing. These symptoms may be an emergency. Get help right away. Call 911. Do not wait to see if the symptoms will go away. Do not drive yourself to the hospital. Summary A sinus infection is soreness and inflammation of your sinuses. Sinuses are hollow spaces in the bones around your face. This condition is caused by nasal tissues that become inflamed or swollen. The swelling traps or blocks the flow of mucus. This allows bacteria, viruses, and fungi to grow, which leads to infection. If you were prescribed an antibiotic medicine, take  it as told by your health care provider. Do not stop taking the antibiotic even if you start to feel better. Keep all follow-up visits. This is important. This information is not intended to replace advice given to you by your health care provider. Make sure you discuss any questions you have with your health care provider. Document Revised: 11/29/2020 Document Reviewed: 11/29/2020 Elsevier Patient Education  2023 Elsevier Inc.    If you have been instructed to have an in-person evaluation today at a local Urgent Care facility, please use the link below. It will take you to a list of all of our available [facility], including address, phone  number and hours of operation. Please do not delay care.  [facility]  If you or a family member do not have a primary care provider, use the link below to schedule a visit and establish care. When you choose a [facility] primary care physician or advanced practice provider, you gain a long-term partner in health. Find a Primary Care Provider  Learn more about [facility]'s in-office and virtual care options: [facility] - Get Care Now

## 2021-06-15 ENCOUNTER — Other Ambulatory Visit: Payer: Self-pay | Admitting: Nurse Practitioner

## 2021-06-15 ENCOUNTER — Ambulatory Visit
Admission: RE | Admit: 2021-06-15 | Discharge: 2021-06-15 | Disposition: A | Discharge status: Home / Self Care | Payer: 59 | Source: Ambulatory Visit | Attending: Nurse Practitioner | Admitting: Nurse Practitioner

## 2021-06-15 DIAGNOSIS — Z1231 Encounter for screening mammogram for malignant neoplasm of breast: Secondary | ICD-10-CM

## 2021-07-25 DIAGNOSIS — Z01419 Encounter for gynecological examination (general) (routine) without abnormal findings: Secondary | ICD-10-CM | POA: Diagnosis not present

## 2021-07-25 DIAGNOSIS — Z124 Encounter for screening for malignant neoplasm of cervix: Secondary | ICD-10-CM | POA: Diagnosis not present

## 2021-07-25 DIAGNOSIS — Z1151 Encounter for screening for human papillomavirus (HPV): Secondary | ICD-10-CM | POA: Diagnosis not present

## 2021-07-25 DIAGNOSIS — Z6832 Body mass index (BMI) 32.0-32.9, adult: Secondary | ICD-10-CM | POA: Diagnosis not present

## 2021-08-31 ENCOUNTER — Ambulatory Visit: Payer: 59

## 2021-10-13 ENCOUNTER — Telehealth: Payer: 59 | Admitting: Physician Assistant

## 2021-10-13 DIAGNOSIS — J020 Streptococcal pharyngitis: Secondary | ICD-10-CM

## 2021-10-13 MED ORDER — AMOXICILLIN 500 MG PO CAPS: 500.0000 mg | ORAL_CAPSULE | Freq: Two times a day (BID) | ORAL | 0 refills | Status: AC

## 2021-10-13 MED ORDER — BENZONATATE 100 MG PO CAPS: 100.0000 mg | ORAL_CAPSULE | Freq: Three times a day (TID) | ORAL | 0 refills | Status: DC | PRN

## 2021-10-13 MED ORDER — PSEUDOEPH-BROMPHEN-DM 30-2-10 MG/5ML PO SYRP: 5.0000 mL | ORAL_SOLUTION | Freq: Four times a day (QID) | ORAL | 0 refills | Status: DC | PRN

## 2021-10-13 NOTE — Patient Instructions (Signed)
Bailey Lewis, thank you for joining [name], PA-C for today's virtual visit.  While this provider is not your primary care provider (PCP), if your PCP is located in our provider database this encounter information will be shared with them immediately following your visit.  Consent: (Patient) Bailey Lewis provided verbal consent for this virtual visit at the beginning of the encounter.  Current Medications:  Current Outpatient Medications:    amoxicillin (AMOXIL) 500 MG capsule, Take 1 capsule (500 mg total) by mouth 2 (two) times daily for 10 days., Disp: 20 capsule, Rfl: 0   benzonatate (TESSALON) 100 MG capsule, Take 1 capsule (100 mg total) by mouth 3 (three) times daily as needed., Disp: 30 capsule, Rfl: 0   brompheniramine-pseudoephedrine-DM 30-2-10 MG/5ML syrup, Take 5 mLs by mouth 4 (four) times daily as needed., Disp: 120 mL, Rfl: 0   betamethasone valerate (VALISONE) 0.1 % cream, Apply 0.1 % topically., Disp: , Rfl:    clobetasol ointment (TEMOVATE) 0.05 %, Apply 2 times a day to affected areas.  Stop when smooth. Do not apply to face or skin folds., Disp: , Rfl:    hydrOXYzine (ATARAX/VISTARIL) 50 MG tablet, Take 1 tablet (50 mg total) by mouth at bedtime and may repeat dose one time if needed. For sleep, Disp: 60 tablet, Rfl: 3   Medications ordered in this encounter:  Meds ordered this encounter  Medications   amoxicillin (AMOXIL) 500 MG capsule    Sig: Take 1 capsule (500 mg total) by mouth 2 (two) times daily for 10 days.    Dispense:  20 capsule    Refill:  0    Order Specific Question:   Supervising Provider    Answer:   Merrilee Jansky [[id number]]   benzonatate (TESSALON) 100 MG capsule    Sig: Take 1 capsule (100 mg total) by mouth 3 (three) times daily as needed.    Dispense:  30 capsule    Refill:  0    Order Specific Question:   Supervising Provider    Answer:   Merrilee Jansky [id number]   brompheniramine-pseudoephedrine-DM 30-2-10 MG/5ML syrup     Sig: Take 5 mLs by mouth 4 (four) times daily as needed.    Dispense:  120 mL    Refill:  0    Order Specific Question:   Supervising Provider    Answer:   Merrilee Jansky [[id number]]     *If you need refills on other medications prior to your next appointment, please contact your pharmacy*  Follow-Up: Call back or seek an in-person evaluation if the symptoms worsen or if the condition fails to improve as anticipated.   Virtual Care [phone]  Other Instructions  Strep Throat, Adult Strep throat is an infection in the throat that is caused by bacteria. It is common during the cold months of the year. It mostly affects children who are 67-26 years old. However, people of all ages can get it at any time of the year. This infection spreads from person to person (is contagious) through coughing, sneezing, or having close contact. Your health care provider may use other names to describe the infection. When strep throat affects the tonsils, it is called tonsillitis. When it affects the back of the throat, it is called pharyngitis. What are the causes? This condition is caused by the Streptococcus pyogenes bacteria. What increases the risk? You are more likely to develop this condition if: You care for school-age children, or are around school-age children.  Children are more likely to get strep throat and may spread it to others. You spend time in crowded places where the infection can spread easily. You have close contact with someone who has strep throat. What are the signs or symptoms? Symptoms of this condition include: Fever or chills. Redness, swelling, or pain in the tonsils or throat. Pain or difficulty when swallowing. White or yellow spots on the tonsils or throat. Tender glands in the neck and under the jaw. Bad smelling breath. Red rash all over the body. This is rare. How is this diagnosed? This condition is diagnosed by tests that check for the presence  and the amount of bacteria that cause strep throat. They are: Rapid strep test. Your throat is swabbed and checked for the presence of bacteria. Results are usually ready in minutes. Throat culture test. Your throat is swabbed. The sample is placed in a cup that allows infections to grow. Results are usually ready in 1 or 2 days. How is this treated? This condition may be treated with: Medicines that kill germs (antibiotics). Medicines that relieve pain or fever. These include: Ibuprofen or acetaminophen. Aspirin, only for people who are over the age of 55. Throat lozenges. Throat sprays. Follow these instructions at home: Medicines  Take over-the-counter and prescription medicines only as told by your health care provider. Take your antibiotic medicine as told by your health care provider. Do not stop taking the antibiotic even if you start to feel better. Eating and drinking  If you have trouble swallowing, try eating soft foods until your sore throat feels better. Drink enough fluid to keep your urine pale yellow. To help relieve pain, you may have: Warm fluids, such as soup and tea. Cold fluids, such as frozen desserts or popsicles. General instructions Gargle with a salt-water mixture 3-4 times a day or as needed. To make a salt-water mixture, completely dissolve -1 tsp (3-6 g) of salt in 1 cup (237 mL) of warm water. Get plenty of rest. Stay home from work or school until you have been taking antibiotics for 24 hours. Do not use any products that contain nicotine or tobacco. These products include cigarettes, chewing tobacco, and vaping devices, such as e-cigarettes. If you need help quitting, ask your health care provider. It is up to you to get your test results. Ask your health care provider, or the department that is doing the test, when your results will be ready. Keep all follow-up visits. This is important. How is this prevented?  Do not share food, drinking cups, or  personal items that could cause the infection to spread to other people. Wash your hands often with soap and water for at least 20 seconds. If soap and water are not available, use hand sanitizer. Make sure that all people in your house wash their hands well. Have family members tested if they have a sore throat or fever. They may need an antibiotic if they have strep throat. Contact a health care provider if: You have swelling in your neck that keeps getting bigger. You develop a rash, cough, or earache. You cough up a thick mucus that is green, yellow-brown, or bloody. You have pain or discomfort that does not get better with medicine. Your symptoms seem to be getting worse. You have a fever. Get help right away if: You have new symptoms, such as vomiting, severe headache, stiff or painful neck, chest pain, or shortness of breath. You have severe throat pain, drooling, or changes in your  voice. You have swelling of the neck, or the skin on the neck becomes red and tender. You have signs of dehydration, such as tiredness (fatigue), dry mouth, and decreased urination. You become increasingly sleepy, or you cannot wake up completely. Your joints become red or painful. These symptoms may represent a serious problem that is an emergency. Do not wait to see if the symptoms will go away. Get medical help right away. Call your local emergency services (911 in the [place] Do not drive yourself to the hospital. Summary Strep throat is an infection in the throat that is caused by the Streptococcus pyogenes bacteria. This infection is spread from person to person (is contagious) through coughing, sneezing, or having close contact. Take your medicines, including antibiotics, as told by your health care provider. Do not stop taking the antibiotic even if you start to feel better. To prevent the spread of germs, wash your hands well with soap and water. Have others do the same. Do not share food, drinking  cups, or personal items. Get help right away if you have new symptoms, such as vomiting, severe headache, stiff or painful neck, chest pain, or shortness of breath. This information is not intended to replace advice given to you by your health care provider. Make sure you discuss any questions you have with your health care provider. Document Revised: 04/19/2020 Document Reviewed: 04/19/2020 Elsevier Patient Education  2023 Elsevier Inc.    If you have been instructed to have an in-person evaluation today at a local Urgent Care facility, please use the link below. It will take you to a list of all of our available [facility], including address, phone number and hours of operation. Please do not delay care.  [facility]  If you or a family member do not have a primary care provider, use the link below to schedule a visit and establish care. When you choose a [facility] primary care physician or advanced practice provider, you gain a long-term partner in health. Find a Primary Care Provider  Learn more about [facility]'s in-office and virtual care options: [facility] - Get Care Now

## 2021-10-13 NOTE — Progress Notes (Signed)
Virtual Visit Consent   Bailey Lewis, you are scheduled for a virtual visit with a [facility] provider today. Just as with appointments in the office, your consent must be obtained to participate. Your consent will be active for this visit and any virtual visit you may have with one of our providers in the next 365 days. If you have a MyChart account, a copy of this consent can be sent to you electronically.  As this is a virtual visit, video technology does not allow for your provider to perform a traditional examination. This may limit your provider's ability to fully assess your condition. If your provider identifies any concerns that need to be evaluated in person or the need to arrange testing (such as labs, EKG, etc.), we will make arrangements to do so. Although advances in technology are sophisticated, we cannot ensure that it will always work on either your end or our end. If the connection with a video visit is poor, the visit may have to be switched to a telephone visit. With either a video or telephone visit, we are not always able to ensure that we have a secure connection.  By engaging in this virtual visit, you consent to the provision of healthcare and authorize for your insurance to be billed (if applicable) for the services provided during this visit. Depending on your insurance coverage, you may receive a charge related to this service.  I need to obtain your verbal consent now. Are you willing to proceed with your visit today? [name] has provided verbal consent on 10/13/2021 for a virtual visit (video or telephone). [name], PA-C  Date: 10/13/2021 11:53 AM  Virtual Visit via Video Note   I, [name], connected with  Bailey Lewis  ([id number], 08-19-1960) on 10/13/21 at 11:45 AM EDT by a video-enabled telemedicine application and verified that I am speaking with the correct person using two identifiers.  Location: Patient: Virtual Visit  Location Patient: Home Provider: Virtual Visit Location Provider: Home Office   I discussed the limitations of evaluation and management by telemedicine and the availability of in person appointments. The patient expressed understanding and agreed to proceed.    History of Present Illness: Bailey Lewis is a 61 y.o. who identifies as a female who was assigned female at birth, and is being seen today for flu-like symptoms.  HPI: Influenza This is a new problem. The current episode started today. The problem occurs constantly. The problem has been gradually worsening. Associated symptoms include chills, congestion, coughing, fatigue, headaches, myalgias, a sore throat (worst symptom) and swollen glands. Pertinent negatives include no fever. Associated symptoms comments: Watery eyes. Nothing aggravates the symptoms. She has tried acetaminophen and sleep (nyquil) for the symptoms. The treatment provided no relief.    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/05/2020   Emotional sensitivity 08/05/2020   Other insomnia 08/05/2020   Crohn's disease with complication ([place]) 123456   Weight gain 08/05/2020   Former smoker 08/05/2020   Encounter to establish care 08/05/2020   Vitamin D deficiency 08/05/2020   Psoriasis 07/29/2019   SBO (small bowel obstruction) ([place]) 08/09/2018    Allergies:  Allergies  Allergen Reactions   Asa [Aspirin] Other (See Comments)    Asthmatic  Reglan [Metoclopramide]    Medications:  Current Outpatient Medications:    amoxicillin (AMOXIL) 500 MG capsule, Take 1 capsule (500 mg total) by mouth 2 (two) times daily for 10 days., Disp: 20 capsule, Rfl: 0   benzonatate (TESSALON) 100 MG capsule, Take 1 capsule (100 mg total) by mouth 3 (three) times daily as  needed., Disp: 30 capsule, Rfl: 0   brompheniramine-pseudoephedrine-DM 30-2-10 MG/5ML syrup, Take 5 mLs by mouth 4 (four) times daily as needed., Disp: 120 mL, Rfl: 0   betamethasone valerate (VALISONE) 0.1 % cream, Apply 0.1 % topically., Disp: , Rfl:    clobetasol ointment (TEMOVATE) 0.05 %, Apply 2 times a day to affected areas.  Stop when smooth. Do not apply to face or skin folds., Disp: , Rfl:    hydrOXYzine (ATARAX/VISTARIL) 50 MG tablet, Take 1 tablet (50 mg total) by mouth at bedtime and may repeat dose one time if needed. For sleep, Disp: 60 tablet, Rfl: 3  Observations/Objective: Patient is well-developed, well-nourished in no acute distress.  Resting comfortably at home.  Head is normocephalic, atraumatic.  No labored breathing.  Speech is clear and coherent with logical content.  Patient is alert and oriented at baseline.    Assessment and Plan: 1. Strep pharyngitis - amoxicillin (AMOXIL) 500 MG capsule; Take 1 capsule (500 mg total) by mouth 2 (two) times daily for 10 days.  Dispense: 20 capsule; Refill: 0 - benzonatate (TESSALON) 100 MG capsule; Take 1 capsule (100 mg total) by mouth 3 (three) times daily as needed.  Dispense: 30 capsule; Refill: 0 - brompheniramine-pseudoephedrine-DM 30-2-10 MG/5ML syrup; Take 5 mLs by mouth 4 (four) times daily as needed.  Dispense: 120 mL; Refill: 0  - Suspect strep throat; but did advise to take at home covid 19 test and will change treatment if positive; she agrees - Amoxicillin, Bromfed DM and Tessalon perles prescribed - Tylenol and Ibuprofen alternating every 4 hours - Salt water gargles - Chloraseptic spray - Liquid and soft food diet - Push fluids - New toothbrush in 3 days - Seek in person evaluation if not improving or if symptoms worsen   Follow Up Instructions: I discussed the assessment and treatment plan with the patient. The patient was provided an opportunity to ask questions and all were answered. The patient agreed  with the plan and demonstrated an understanding of the instructions.  A copy of instructions were sent to the patient via MyChart unless otherwise noted below.    The patient was advised to call back or seek an in-person evaluation if the symptoms worsen or if the condition fails to improve as anticipated.  Time:  I spent 11 minutes with the patient via telehealth technology discussing the above problems/concerns.    [name], PA-C

## 2021-10-30 ENCOUNTER — Encounter: Payer: Self-pay | Admitting: Physician Assistant

## 2021-11-04 DIAGNOSIS — M542 Cervicalgia: Secondary | ICD-10-CM | POA: Diagnosis not present

## 2021-11-04 DIAGNOSIS — R079 Chest pain, unspecified: Secondary | ICD-10-CM | POA: Diagnosis not present

## 2021-11-04 DIAGNOSIS — I10 Essential (primary) hypertension: Secondary | ICD-10-CM | POA: Diagnosis not present

## 2021-11-04 DIAGNOSIS — R918 Other nonspecific abnormal finding of lung field: Secondary | ICD-10-CM | POA: Diagnosis not present

## 2021-11-04 DIAGNOSIS — M25561 Pain in right knee: Secondary | ICD-10-CM | POA: Diagnosis not present

## 2021-11-04 DIAGNOSIS — Z043 Encounter for examination and observation following other accident: Secondary | ICD-10-CM | POA: Diagnosis not present

## 2021-11-04 DIAGNOSIS — S92061A Displaced intraarticular fracture of right calcaneus, initial encounter for closed fracture: Secondary | ICD-10-CM | POA: Diagnosis not present

## 2021-11-04 DIAGNOSIS — M79622 Pain in left upper arm: Secondary | ICD-10-CM | POA: Diagnosis not present

## 2021-11-04 DIAGNOSIS — M25521 Pain in right elbow: Secondary | ICD-10-CM | POA: Diagnosis not present

## 2021-11-04 DIAGNOSIS — T1490XA Injury, unspecified, initial encounter: Secondary | ICD-10-CM | POA: Diagnosis not present

## 2021-11-04 DIAGNOSIS — S92001A Unspecified fracture of right calcaneus, initial encounter for closed fracture: Secondary | ICD-10-CM | POA: Diagnosis not present

## 2021-11-04 DIAGNOSIS — S27892A Contusion of other specified intrathoracic organs, initial encounter: Secondary | ICD-10-CM | POA: Diagnosis not present

## 2021-11-04 DIAGNOSIS — D62 Acute posthemorrhagic anemia: Secondary | ICD-10-CM | POA: Diagnosis not present

## 2021-11-04 DIAGNOSIS — S22081A Stable burst fracture of T11-T12 vertebra, initial encounter for closed fracture: Secondary | ICD-10-CM | POA: Diagnosis not present

## 2021-11-04 DIAGNOSIS — S22088A Other fracture of T11-T12 vertebra, initial encounter for closed fracture: Secondary | ICD-10-CM | POA: Diagnosis not present

## 2021-11-04 DIAGNOSIS — Y999 Unspecified external cause status: Secondary | ICD-10-CM | POA: Diagnosis not present

## 2021-11-04 DIAGNOSIS — I2699 Other pulmonary embolism without acute cor pulmonale: Secondary | ICD-10-CM | POA: Diagnosis not present

## 2021-11-04 DIAGNOSIS — M810 Age-related osteoporosis without current pathological fracture: Secondary | ICD-10-CM | POA: Diagnosis not present

## 2021-11-04 DIAGNOSIS — W19XXXA Unspecified fall, initial encounter: Secondary | ICD-10-CM | POA: Diagnosis not present

## 2021-11-04 DIAGNOSIS — Z87891 Personal history of nicotine dependence: Secondary | ICD-10-CM | POA: Diagnosis not present

## 2021-11-04 DIAGNOSIS — M25571 Pain in right ankle and joints of right foot: Secondary | ICD-10-CM | POA: Diagnosis not present

## 2021-11-04 DIAGNOSIS — S22089A Unspecified fracture of T11-T12 vertebra, initial encounter for closed fracture: Secondary | ICD-10-CM | POA: Diagnosis not present

## 2021-11-04 DIAGNOSIS — M545 Low back pain, unspecified: Secondary | ICD-10-CM | POA: Diagnosis not present

## 2021-11-04 DIAGNOSIS — S92011A Displaced fracture of body of right calcaneus, initial encounter for closed fracture: Secondary | ICD-10-CM | POA: Diagnosis not present

## 2021-11-04 DIAGNOSIS — S2500XA Unspecified injury of thoracic aorta, initial encounter: Secondary | ICD-10-CM | POA: Diagnosis not present

## 2021-11-04 DIAGNOSIS — W133XXA Fall through floor, initial encounter: Secondary | ICD-10-CM | POA: Diagnosis not present

## 2021-11-04 DIAGNOSIS — M549 Dorsalgia, unspecified: Secondary | ICD-10-CM | POA: Diagnosis not present

## 2021-11-10 DIAGNOSIS — S92001A Unspecified fracture of right calcaneus, initial encounter for closed fracture: Secondary | ICD-10-CM | POA: Insufficient documentation

## 2021-11-14 DIAGNOSIS — S22081D Stable burst fracture of T11-T12 vertebra, subsequent encounter for fracture with routine healing: Secondary | ICD-10-CM | POA: Diagnosis not present

## 2021-11-14 DIAGNOSIS — K5090 Crohn's disease, unspecified, without complications: Secondary | ICD-10-CM | POA: Diagnosis not present

## 2021-11-14 DIAGNOSIS — W132XXD Fall from, out of or through roof, subsequent encounter: Secondary | ICD-10-CM | POA: Diagnosis not present

## 2021-11-14 DIAGNOSIS — S92001D Unspecified fracture of right calcaneus, subsequent encounter for fracture with routine healing: Secondary | ICD-10-CM | POA: Diagnosis not present

## 2021-11-20 DIAGNOSIS — K5090 Crohn's disease, unspecified, without complications: Secondary | ICD-10-CM | POA: Diagnosis not present

## 2021-11-20 DIAGNOSIS — W132XXD Fall from, out of or through roof, subsequent encounter: Secondary | ICD-10-CM | POA: Diagnosis not present

## 2021-11-20 DIAGNOSIS — S22081D Stable burst fracture of T11-T12 vertebra, subsequent encounter for fracture with routine healing: Secondary | ICD-10-CM | POA: Diagnosis not present

## 2021-11-20 DIAGNOSIS — S92001D Unspecified fracture of right calcaneus, subsequent encounter for fracture with routine healing: Secondary | ICD-10-CM | POA: Diagnosis not present

## 2021-11-20 DIAGNOSIS — S92011A Displaced fracture of body of right calcaneus, initial encounter for closed fracture: Secondary | ICD-10-CM | POA: Diagnosis not present

## 2021-11-21 ENCOUNTER — Inpatient Hospital Stay (HOSPITAL_BASED_OUTPATIENT_CLINIC_OR_DEPARTMENT_OTHER): Payer: 59 | Admitting: Nurse Practitioner

## 2021-11-21 DIAGNOSIS — Z09 Encounter for follow-up examination after completed treatment for conditions other than malignant neoplasm: Secondary | ICD-10-CM | POA: Insufficient documentation

## 2021-11-21 DIAGNOSIS — Z87410 Personal history of cervical dysplasia: Secondary | ICD-10-CM | POA: Insufficient documentation

## 2021-11-21 NOTE — Progress Notes (Deleted)
  Tollie Eth, DNP, AGNP-c [facility] [address]  [address] [place], [place] [address] [phone]  Subjective:   Bailey Lewis is a 61 y.o. female presents to day for evaluation of: No chief complaint on file.  Follow up Hospitalization  Patient was admitted to *** on *** and discharged on ***. She was treated for ***. Treatment for this included ***. Telephone follow up was done on *** She reports {excellent/good/fair:19992} compliance with treatment. She reports this condition is {resolved/improved/worsened:23923}.  ----------------------------------------------------------------------------------------- -  PMH, Medications, and Allergies reviewed and updated in chart as appropriate.   ROS negative except for what is listed in HPI. Objective:  There were no vitals taken for this visit. Physical Exam        Assessment & Plan:   Problem List Items Addressed This Visit   None     Tollie Eth, DNP, AGNP-c 11/21/2021  8:11 AM    History, Medications, Surgery, SDOH, and Family History reviewed and updated as appropriate.

## 2021-11-22 DIAGNOSIS — S22081D Stable burst fracture of T11-T12 vertebra, subsequent encounter for fracture with routine healing: Secondary | ICD-10-CM | POA: Diagnosis not present

## 2021-11-22 DIAGNOSIS — W132XXD Fall from, out of or through roof, subsequent encounter: Secondary | ICD-10-CM | POA: Diagnosis not present

## 2021-11-22 DIAGNOSIS — S92001D Unspecified fracture of right calcaneus, subsequent encounter for fracture with routine healing: Secondary | ICD-10-CM | POA: Diagnosis not present

## 2021-11-22 DIAGNOSIS — K5090 Crohn's disease, unspecified, without complications: Secondary | ICD-10-CM | POA: Diagnosis not present

## 2021-11-23 DIAGNOSIS — Z888 Allergy status to other drugs, medicaments and biological substances status: Secondary | ICD-10-CM | POA: Diagnosis not present

## 2021-11-23 DIAGNOSIS — Z886 Allergy status to analgesic agent status: Secondary | ICD-10-CM | POA: Diagnosis not present

## 2021-11-23 DIAGNOSIS — K5090 Crohn's disease, unspecified, without complications: Secondary | ICD-10-CM | POA: Diagnosis not present

## 2021-11-23 DIAGNOSIS — W132XXD Fall from, out of or through roof, subsequent encounter: Secondary | ICD-10-CM | POA: Diagnosis not present

## 2021-11-23 DIAGNOSIS — S22081D Stable burst fracture of T11-T12 vertebra, subsequent encounter for fracture with routine healing: Secondary | ICD-10-CM | POA: Diagnosis not present

## 2021-11-23 DIAGNOSIS — S92001D Unspecified fracture of right calcaneus, subsequent encounter for fracture with routine healing: Secondary | ICD-10-CM | POA: Diagnosis not present

## 2021-11-24 DIAGNOSIS — S22081D Stable burst fracture of T11-T12 vertebra, subsequent encounter for fracture with routine healing: Secondary | ICD-10-CM | POA: Diagnosis not present

## 2021-11-24 DIAGNOSIS — K5090 Crohn's disease, unspecified, without complications: Secondary | ICD-10-CM | POA: Diagnosis not present

## 2021-11-24 DIAGNOSIS — W132XXD Fall from, out of or through roof, subsequent encounter: Secondary | ICD-10-CM | POA: Diagnosis not present

## 2021-11-24 DIAGNOSIS — S92001D Unspecified fracture of right calcaneus, subsequent encounter for fracture with routine healing: Secondary | ICD-10-CM | POA: Diagnosis not present

## 2021-11-28 DIAGNOSIS — S22081D Stable burst fracture of T11-T12 vertebra, subsequent encounter for fracture with routine healing: Secondary | ICD-10-CM | POA: Diagnosis not present

## 2021-11-28 DIAGNOSIS — W132XXD Fall from, out of or through roof, subsequent encounter: Secondary | ICD-10-CM | POA: Diagnosis not present

## 2021-11-28 DIAGNOSIS — S92001D Unspecified fracture of right calcaneus, subsequent encounter for fracture with routine healing: Secondary | ICD-10-CM | POA: Diagnosis not present

## 2021-11-28 DIAGNOSIS — K5090 Crohn's disease, unspecified, without complications: Secondary | ICD-10-CM | POA: Diagnosis not present

## 2021-11-29 DIAGNOSIS — S92001D Unspecified fracture of right calcaneus, subsequent encounter for fracture with routine healing: Secondary | ICD-10-CM | POA: Diagnosis not present

## 2021-11-29 DIAGNOSIS — W132XXD Fall from, out of or through roof, subsequent encounter: Secondary | ICD-10-CM | POA: Diagnosis not present

## 2021-11-29 DIAGNOSIS — K5090 Crohn's disease, unspecified, without complications: Secondary | ICD-10-CM | POA: Diagnosis not present

## 2021-11-29 DIAGNOSIS — S22081D Stable burst fracture of T11-T12 vertebra, subsequent encounter for fracture with routine healing: Secondary | ICD-10-CM | POA: Diagnosis not present

## 2021-12-01 DIAGNOSIS — W132XXD Fall from, out of or through roof, subsequent encounter: Secondary | ICD-10-CM | POA: Diagnosis not present

## 2021-12-01 DIAGNOSIS — S92001D Unspecified fracture of right calcaneus, subsequent encounter for fracture with routine healing: Secondary | ICD-10-CM | POA: Diagnosis not present

## 2021-12-01 DIAGNOSIS — S22081D Stable burst fracture of T11-T12 vertebra, subsequent encounter for fracture with routine healing: Secondary | ICD-10-CM | POA: Diagnosis not present

## 2021-12-01 DIAGNOSIS — K5090 Crohn's disease, unspecified, without complications: Secondary | ICD-10-CM | POA: Diagnosis not present

## 2021-12-04 DIAGNOSIS — S92011D Displaced fracture of body of right calcaneus, subsequent encounter for fracture with routine healing: Secondary | ICD-10-CM | POA: Diagnosis not present

## 2021-12-06 DIAGNOSIS — S22081D Stable burst fracture of T11-T12 vertebra, subsequent encounter for fracture with routine healing: Secondary | ICD-10-CM | POA: Diagnosis not present

## 2021-12-06 DIAGNOSIS — K5090 Crohn's disease, unspecified, without complications: Secondary | ICD-10-CM | POA: Diagnosis not present

## 2021-12-06 DIAGNOSIS — S92001D Unspecified fracture of right calcaneus, subsequent encounter for fracture with routine healing: Secondary | ICD-10-CM | POA: Diagnosis not present

## 2021-12-06 DIAGNOSIS — W132XXD Fall from, out of or through roof, subsequent encounter: Secondary | ICD-10-CM | POA: Diagnosis not present

## 2021-12-11 DIAGNOSIS — S92001D Unspecified fracture of right calcaneus, subsequent encounter for fracture with routine healing: Secondary | ICD-10-CM | POA: Diagnosis not present

## 2021-12-11 DIAGNOSIS — W132XXD Fall from, out of or through roof, subsequent encounter: Secondary | ICD-10-CM | POA: Diagnosis not present

## 2021-12-11 DIAGNOSIS — S22081D Stable burst fracture of T11-T12 vertebra, subsequent encounter for fracture with routine healing: Secondary | ICD-10-CM | POA: Diagnosis not present

## 2021-12-11 DIAGNOSIS — K5090 Crohn's disease, unspecified, without complications: Secondary | ICD-10-CM | POA: Diagnosis not present

## 2021-12-12 DIAGNOSIS — S92001D Unspecified fracture of right calcaneus, subsequent encounter for fracture with routine healing: Secondary | ICD-10-CM | POA: Diagnosis not present

## 2021-12-12 DIAGNOSIS — W132XXD Fall from, out of or through roof, subsequent encounter: Secondary | ICD-10-CM | POA: Diagnosis not present

## 2021-12-12 DIAGNOSIS — K5090 Crohn's disease, unspecified, without complications: Secondary | ICD-10-CM | POA: Diagnosis not present

## 2021-12-12 DIAGNOSIS — S22081D Stable burst fracture of T11-T12 vertebra, subsequent encounter for fracture with routine healing: Secondary | ICD-10-CM | POA: Diagnosis not present

## 2021-12-18 DIAGNOSIS — M85871 Other specified disorders of bone density and structure, right ankle and foot: Secondary | ICD-10-CM | POA: Diagnosis not present

## 2021-12-18 DIAGNOSIS — S92011D Displaced fracture of body of right calcaneus, subsequent encounter for fracture with routine healing: Secondary | ICD-10-CM | POA: Diagnosis not present

## 2021-12-19 DIAGNOSIS — S22081D Stable burst fracture of T11-T12 vertebra, subsequent encounter for fracture with routine healing: Secondary | ICD-10-CM | POA: Diagnosis not present

## 2021-12-19 DIAGNOSIS — K5090 Crohn's disease, unspecified, without complications: Secondary | ICD-10-CM | POA: Diagnosis not present

## 2021-12-19 DIAGNOSIS — S92001D Unspecified fracture of right calcaneus, subsequent encounter for fracture with routine healing: Secondary | ICD-10-CM | POA: Diagnosis not present

## 2021-12-19 DIAGNOSIS — W132XXD Fall from, out of or through roof, subsequent encounter: Secondary | ICD-10-CM | POA: Diagnosis not present

## 2021-12-20 DIAGNOSIS — Z13820 Encounter for screening for osteoporosis: Secondary | ICD-10-CM | POA: Diagnosis not present

## 2021-12-22 DIAGNOSIS — W132XXD Fall from, out of or through roof, subsequent encounter: Secondary | ICD-10-CM | POA: Diagnosis not present

## 2021-12-22 DIAGNOSIS — S92001D Unspecified fracture of right calcaneus, subsequent encounter for fracture with routine healing: Secondary | ICD-10-CM | POA: Diagnosis not present

## 2021-12-22 DIAGNOSIS — S22081D Stable burst fracture of T11-T12 vertebra, subsequent encounter for fracture with routine healing: Secondary | ICD-10-CM | POA: Diagnosis not present

## 2021-12-22 DIAGNOSIS — K5090 Crohn's disease, unspecified, without complications: Secondary | ICD-10-CM | POA: Diagnosis not present

## 2022-01-02 DIAGNOSIS — S92001D Unspecified fracture of right calcaneus, subsequent encounter for fracture with routine healing: Secondary | ICD-10-CM | POA: Diagnosis not present

## 2022-01-02 DIAGNOSIS — S22081D Stable burst fracture of T11-T12 vertebra, subsequent encounter for fracture with routine healing: Secondary | ICD-10-CM | POA: Diagnosis not present

## 2022-01-02 DIAGNOSIS — K5090 Crohn's disease, unspecified, without complications: Secondary | ICD-10-CM | POA: Diagnosis not present

## 2022-01-02 DIAGNOSIS — W132XXD Fall from, out of or through roof, subsequent encounter: Secondary | ICD-10-CM | POA: Diagnosis not present

## 2022-01-05 DIAGNOSIS — K5090 Crohn's disease, unspecified, without complications: Secondary | ICD-10-CM | POA: Diagnosis not present

## 2022-01-05 DIAGNOSIS — S92001D Unspecified fracture of right calcaneus, subsequent encounter for fracture with routine healing: Secondary | ICD-10-CM | POA: Diagnosis not present

## 2022-01-05 DIAGNOSIS — W132XXD Fall from, out of or through roof, subsequent encounter: Secondary | ICD-10-CM | POA: Diagnosis not present

## 2022-01-05 DIAGNOSIS — S22081D Stable burst fracture of T11-T12 vertebra, subsequent encounter for fracture with routine healing: Secondary | ICD-10-CM | POA: Diagnosis not present

## 2022-01-11 DIAGNOSIS — K5090 Crohn's disease, unspecified, without complications: Secondary | ICD-10-CM | POA: Diagnosis not present

## 2022-01-11 DIAGNOSIS — Z886 Allergy status to analgesic agent status: Secondary | ICD-10-CM | POA: Diagnosis not present

## 2022-01-11 DIAGNOSIS — W132XXD Fall from, out of or through roof, subsequent encounter: Secondary | ICD-10-CM | POA: Diagnosis not present

## 2022-01-11 DIAGNOSIS — Z888 Allergy status to other drugs, medicaments and biological substances status: Secondary | ICD-10-CM | POA: Diagnosis not present

## 2022-01-11 DIAGNOSIS — M6281 Muscle weakness (generalized): Secondary | ICD-10-CM | POA: Diagnosis not present

## 2022-01-11 DIAGNOSIS — S22081D Stable burst fracture of T11-T12 vertebra, subsequent encounter for fracture with routine healing: Secondary | ICD-10-CM | POA: Diagnosis not present

## 2022-01-11 DIAGNOSIS — S92001D Unspecified fracture of right calcaneus, subsequent encounter for fracture with routine healing: Secondary | ICD-10-CM | POA: Diagnosis not present

## 2022-01-11 DIAGNOSIS — M5146 Schmorl's nodes, lumbar region: Secondary | ICD-10-CM | POA: Diagnosis not present

## 2022-01-15 DIAGNOSIS — S22081D Stable burst fracture of T11-T12 vertebra, subsequent encounter for fracture with routine healing: Secondary | ICD-10-CM | POA: Diagnosis not present

## 2022-01-15 DIAGNOSIS — W132XXD Fall from, out of or through roof, subsequent encounter: Secondary | ICD-10-CM | POA: Diagnosis not present

## 2022-01-15 DIAGNOSIS — M6281 Muscle weakness (generalized): Secondary | ICD-10-CM | POA: Diagnosis not present

## 2022-01-15 DIAGNOSIS — S92001D Unspecified fracture of right calcaneus, subsequent encounter for fracture with routine healing: Secondary | ICD-10-CM | POA: Diagnosis not present

## 2022-01-15 DIAGNOSIS — K5090 Crohn's disease, unspecified, without complications: Secondary | ICD-10-CM | POA: Diagnosis not present

## 2022-01-17 ENCOUNTER — Encounter: Payer: Self-pay | Admitting: Internal Medicine

## 2022-01-17 DIAGNOSIS — W132XXD Fall from, out of or through roof, subsequent encounter: Secondary | ICD-10-CM | POA: Diagnosis not present

## 2022-01-17 DIAGNOSIS — S92001D Unspecified fracture of right calcaneus, subsequent encounter for fracture with routine healing: Secondary | ICD-10-CM | POA: Diagnosis not present

## 2022-01-17 DIAGNOSIS — K5090 Crohn's disease, unspecified, without complications: Secondary | ICD-10-CM | POA: Diagnosis not present

## 2022-01-17 DIAGNOSIS — M6281 Muscle weakness (generalized): Secondary | ICD-10-CM | POA: Diagnosis not present

## 2022-01-17 DIAGNOSIS — S22081D Stable burst fracture of T11-T12 vertebra, subsequent encounter for fracture with routine healing: Secondary | ICD-10-CM | POA: Diagnosis not present

## 2022-02-12 DIAGNOSIS — S22081D Stable burst fracture of T11-T12 vertebra, subsequent encounter for fracture with routine healing: Secondary | ICD-10-CM | POA: Diagnosis not present

## 2022-02-12 DIAGNOSIS — K5090 Crohn's disease, unspecified, without complications: Secondary | ICD-10-CM | POA: Diagnosis not present

## 2022-02-12 DIAGNOSIS — Z791 Long term (current) use of non-steroidal anti-inflammatories (NSAID): Secondary | ICD-10-CM | POA: Diagnosis not present

## 2022-02-12 DIAGNOSIS — W132XXD Fall from, out of or through roof, subsequent encounter: Secondary | ICD-10-CM | POA: Diagnosis not present

## 2022-02-12 DIAGNOSIS — S92001D Unspecified fracture of right calcaneus, subsequent encounter for fracture with routine healing: Secondary | ICD-10-CM | POA: Diagnosis not present

## 2022-02-12 DIAGNOSIS — Z87891 Personal history of nicotine dependence: Secondary | ICD-10-CM | POA: Diagnosis not present

## 2022-02-12 DIAGNOSIS — M85871 Other specified disorders of bone density and structure, right ankle and foot: Secondary | ICD-10-CM | POA: Diagnosis not present

## 2022-02-16 DIAGNOSIS — Z791 Long term (current) use of non-steroidal anti-inflammatories (NSAID): Secondary | ICD-10-CM | POA: Diagnosis not present

## 2022-02-16 DIAGNOSIS — S22081D Stable burst fracture of T11-T12 vertebra, subsequent encounter for fracture with routine healing: Secondary | ICD-10-CM | POA: Diagnosis not present

## 2022-02-16 DIAGNOSIS — M85871 Other specified disorders of bone density and structure, right ankle and foot: Secondary | ICD-10-CM | POA: Diagnosis not present

## 2022-02-16 DIAGNOSIS — K5090 Crohn's disease, unspecified, without complications: Secondary | ICD-10-CM | POA: Diagnosis not present

## 2022-02-16 DIAGNOSIS — Z87891 Personal history of nicotine dependence: Secondary | ICD-10-CM | POA: Diagnosis not present

## 2022-02-16 DIAGNOSIS — S92001D Unspecified fracture of right calcaneus, subsequent encounter for fracture with routine healing: Secondary | ICD-10-CM | POA: Diagnosis not present

## 2022-02-16 DIAGNOSIS — W132XXD Fall from, out of or through roof, subsequent encounter: Secondary | ICD-10-CM | POA: Diagnosis not present

## 2022-02-19 DIAGNOSIS — S92011D Displaced fracture of body of right calcaneus, subsequent encounter for fracture with routine healing: Secondary | ICD-10-CM | POA: Diagnosis not present

## 2022-02-19 DIAGNOSIS — M85871 Other specified disorders of bone density and structure, right ankle and foot: Secondary | ICD-10-CM | POA: Diagnosis not present

## 2022-02-21 DIAGNOSIS — M85871 Other specified disorders of bone density and structure, right ankle and foot: Secondary | ICD-10-CM | POA: Diagnosis not present

## 2022-02-21 DIAGNOSIS — Z791 Long term (current) use of non-steroidal anti-inflammatories (NSAID): Secondary | ICD-10-CM | POA: Diagnosis not present

## 2022-02-21 DIAGNOSIS — Z87891 Personal history of nicotine dependence: Secondary | ICD-10-CM | POA: Diagnosis not present

## 2022-02-21 DIAGNOSIS — S22081D Stable burst fracture of T11-T12 vertebra, subsequent encounter for fracture with routine healing: Secondary | ICD-10-CM | POA: Diagnosis not present

## 2022-02-21 DIAGNOSIS — S92001D Unspecified fracture of right calcaneus, subsequent encounter for fracture with routine healing: Secondary | ICD-10-CM | POA: Diagnosis not present

## 2022-02-21 DIAGNOSIS — W132XXD Fall from, out of or through roof, subsequent encounter: Secondary | ICD-10-CM | POA: Diagnosis not present

## 2022-02-21 DIAGNOSIS — K5090 Crohn's disease, unspecified, without complications: Secondary | ICD-10-CM | POA: Diagnosis not present

## 2022-02-23 DIAGNOSIS — S92001D Unspecified fracture of right calcaneus, subsequent encounter for fracture with routine healing: Secondary | ICD-10-CM | POA: Diagnosis not present

## 2022-02-23 DIAGNOSIS — K5090 Crohn's disease, unspecified, without complications: Secondary | ICD-10-CM | POA: Diagnosis not present

## 2022-02-23 DIAGNOSIS — Z87891 Personal history of nicotine dependence: Secondary | ICD-10-CM | POA: Diagnosis not present

## 2022-02-23 DIAGNOSIS — Z791 Long term (current) use of non-steroidal anti-inflammatories (NSAID): Secondary | ICD-10-CM | POA: Diagnosis not present

## 2022-02-23 DIAGNOSIS — M85871 Other specified disorders of bone density and structure, right ankle and foot: Secondary | ICD-10-CM | POA: Diagnosis not present

## 2022-02-23 DIAGNOSIS — S22081D Stable burst fracture of T11-T12 vertebra, subsequent encounter for fracture with routine healing: Secondary | ICD-10-CM | POA: Diagnosis not present

## 2022-02-23 DIAGNOSIS — W132XXD Fall from, out of or through roof, subsequent encounter: Secondary | ICD-10-CM | POA: Diagnosis not present

## 2022-02-28 DIAGNOSIS — M85871 Other specified disorders of bone density and structure, right ankle and foot: Secondary | ICD-10-CM | POA: Diagnosis not present

## 2022-02-28 DIAGNOSIS — K5090 Crohn's disease, unspecified, without complications: Secondary | ICD-10-CM | POA: Diagnosis not present

## 2022-02-28 DIAGNOSIS — Z791 Long term (current) use of non-steroidal anti-inflammatories (NSAID): Secondary | ICD-10-CM | POA: Diagnosis not present

## 2022-02-28 DIAGNOSIS — S92001D Unspecified fracture of right calcaneus, subsequent encounter for fracture with routine healing: Secondary | ICD-10-CM | POA: Diagnosis not present

## 2022-02-28 DIAGNOSIS — W132XXD Fall from, out of or through roof, subsequent encounter: Secondary | ICD-10-CM | POA: Diagnosis not present

## 2022-02-28 DIAGNOSIS — Z87891 Personal history of nicotine dependence: Secondary | ICD-10-CM | POA: Diagnosis not present

## 2022-02-28 DIAGNOSIS — S22081D Stable burst fracture of T11-T12 vertebra, subsequent encounter for fracture with routine healing: Secondary | ICD-10-CM | POA: Diagnosis not present

## 2022-03-06 DIAGNOSIS — K5090 Crohn's disease, unspecified, without complications: Secondary | ICD-10-CM | POA: Diagnosis not present

## 2022-03-06 DIAGNOSIS — W132XXD Fall from, out of or through roof, subsequent encounter: Secondary | ICD-10-CM | POA: Diagnosis not present

## 2022-03-06 DIAGNOSIS — S92001D Unspecified fracture of right calcaneus, subsequent encounter for fracture with routine healing: Secondary | ICD-10-CM | POA: Diagnosis not present

## 2022-03-06 DIAGNOSIS — Z87891 Personal history of nicotine dependence: Secondary | ICD-10-CM | POA: Diagnosis not present

## 2022-03-06 DIAGNOSIS — M85871 Other specified disorders of bone density and structure, right ankle and foot: Secondary | ICD-10-CM | POA: Diagnosis not present

## 2022-03-06 DIAGNOSIS — Z791 Long term (current) use of non-steroidal anti-inflammatories (NSAID): Secondary | ICD-10-CM | POA: Diagnosis not present

## 2022-03-06 DIAGNOSIS — S22081D Stable burst fracture of T11-T12 vertebra, subsequent encounter for fracture with routine healing: Secondary | ICD-10-CM | POA: Diagnosis not present

## 2022-03-07 DIAGNOSIS — Z791 Long term (current) use of non-steroidal anti-inflammatories (NSAID): Secondary | ICD-10-CM | POA: Diagnosis not present

## 2022-03-07 DIAGNOSIS — S92001D Unspecified fracture of right calcaneus, subsequent encounter for fracture with routine healing: Secondary | ICD-10-CM | POA: Diagnosis not present

## 2022-03-07 DIAGNOSIS — M85871 Other specified disorders of bone density and structure, right ankle and foot: Secondary | ICD-10-CM | POA: Diagnosis not present

## 2022-03-07 DIAGNOSIS — S22081D Stable burst fracture of T11-T12 vertebra, subsequent encounter for fracture with routine healing: Secondary | ICD-10-CM | POA: Diagnosis not present

## 2022-03-07 DIAGNOSIS — Z87891 Personal history of nicotine dependence: Secondary | ICD-10-CM | POA: Diagnosis not present

## 2022-03-07 DIAGNOSIS — W132XXD Fall from, out of or through roof, subsequent encounter: Secondary | ICD-10-CM | POA: Diagnosis not present

## 2022-03-07 DIAGNOSIS — K5090 Crohn's disease, unspecified, without complications: Secondary | ICD-10-CM | POA: Diagnosis not present

## 2022-03-13 ENCOUNTER — Ambulatory Visit: Payer: Commercial Managed Care - PPO | Admitting: Family Medicine

## 2022-03-13 ENCOUNTER — Encounter: Payer: Self-pay | Admitting: Family Medicine

## 2022-03-13 VITALS — BP 117/78 | HR 87 | Temp 98.2°F | Ht 66.0 in | Wt 177.8 lb

## 2022-03-13 DIAGNOSIS — K5090 Crohn's disease, unspecified, without complications: Secondary | ICD-10-CM | POA: Diagnosis not present

## 2022-03-13 DIAGNOSIS — Z7689 Persons encountering health services in other specified circumstances: Secondary | ICD-10-CM | POA: Diagnosis not present

## 2022-03-13 DIAGNOSIS — Z122 Encounter for screening for malignant neoplasm of respiratory organs: Secondary | ICD-10-CM

## 2022-03-13 DIAGNOSIS — Z791 Long term (current) use of non-steroidal anti-inflammatories (NSAID): Secondary | ICD-10-CM | POA: Diagnosis not present

## 2022-03-13 DIAGNOSIS — M85871 Other specified disorders of bone density and structure, right ankle and foot: Secondary | ICD-10-CM | POA: Diagnosis not present

## 2022-03-13 DIAGNOSIS — Z1211 Encounter for screening for malignant neoplasm of colon: Secondary | ICD-10-CM

## 2022-03-13 DIAGNOSIS — R1900 Intra-abdominal and pelvic swelling, mass and lump, unspecified site: Secondary | ICD-10-CM | POA: Diagnosis not present

## 2022-03-13 DIAGNOSIS — S22081D Stable burst fracture of T11-T12 vertebra, subsequent encounter for fracture with routine healing: Secondary | ICD-10-CM | POA: Diagnosis not present

## 2022-03-13 DIAGNOSIS — Z87891 Personal history of nicotine dependence: Secondary | ICD-10-CM | POA: Diagnosis not present

## 2022-03-13 DIAGNOSIS — S92001D Unspecified fracture of right calcaneus, subsequent encounter for fracture with routine healing: Secondary | ICD-10-CM | POA: Diagnosis not present

## 2022-03-13 DIAGNOSIS — W132XXD Fall from, out of or through roof, subsequent encounter: Secondary | ICD-10-CM | POA: Diagnosis not present

## 2022-03-13 NOTE — Progress Notes (Signed)
New Patient Office Visit  Subjective    Patient ID: Bailey Lewis, female    DOB: June 22, 1960  Age: 62 y.o. MRN: [id number]  CC:  Chief Complaint  Patient presents with   Establish Care    Last PCP, [facility]   [facility]    Pt states that when she lays down and sits up really fast, she notices a bulge. She noticed in December, but was not sure due to full body cast. She denies pain with bending, coughing , or lifting.     HPI Bailey Lewis presents to establish care with this practice. She is  new to me. Her previous PCP transferred to a different practice. She would like to discuss abdominal bulge today.   Abdominal wall bulge: on right upper abdomen. Denies pain, no nausea, no vomiting, no erythema, no warmth.   Accidental fall from 10 feet:  Fell through her attic 10 feet, fractured her right calcaneous, T 12, bruised aorta and blood clot in left lung. This happened November 04, 2021. In full body cast for 10 weeks. Has not been back to work, hoping to return 03/26/22. Still using cane due to calcaneous fracture. She is recovering well. Trying to stay positive. Has a daughter and grandson.   Crohn's disease: not currently on treatment. Manages her symptoms.   History and health maintenance reviewed. Not currently on medications.  Up to date on pap. She will bring documentation at next visit.  Referral placed for Cologuard.  Mammogram done 06/19/21: normal CT scan lung cancer screening done 09/29/20. She is due for this. Will place order today.  CPE due after 06/20/22. She will schedule this today.   Outpatient Encounter Medications as of 03/13/2022  Medication Sig   calcium carbonate (OS-CAL) 1250 (500 Ca) MG chewable tablet Chew 1 tablet by mouth daily.   Cholecalciferol (VITAMIN D3) 25 MCG (1000 UT) tablet Take 1 tablet by mouth daily.   [DISCONTINUED] benzonatate (TESSALON) 100 MG capsule Take 1 capsule (100 mg total) by mouth 3 (three) times daily as needed.  (Patient not taking: Reported on 03/13/2022)   [DISCONTINUED] betamethasone valerate (VALISONE) 0.1 % cream Apply 0.1 % topically. (Patient not taking: Reported on 03/13/2022)   [DISCONTINUED] brompheniramine-pseudoephedrine-DM 30-2-10 MG/5ML syrup Take 5 mLs by mouth 4 (four) times daily as needed. (Patient not taking: Reported on 03/13/2022)   [DISCONTINUED] clobetasol ointment (TEMOVATE) 0.05 % Apply 2 times a day to affected areas.  Stop when smooth. Do not apply to face or skin folds. (Patient not taking: Reported on 03/13/2022)   [DISCONTINUED] cyclobenzaprine (FLEXERIL) 5 MG tablet Take 5 mg by mouth 3 (three) times daily as needed for muscle spasms. (Patient not taking: Reported on 03/13/2022)   [DISCONTINUED] enoxaparin (LOVENOX) 40 MG/0.4ML injection Inject 40 mg into the skin daily.   [DISCONTINUED] gabapentin (NEURONTIN) 300 MG capsule Take 300 mg by mouth 3 (three) times daily.   Patient Sig: Neurontin '300mg'$  3 times a day x1 week ('900mg'$ /day) (21 capsules) Neurontin '300mg'$  BID x1 week ('600mg'$ /day)  (14 capsules) Neurontin '300mg'$  daily x1 week ('300mg'$ /day) (7 capsules) Then off  (Total: 42 capsules) (Patient not taking: Reported on 03/13/2022)   [DISCONTINUED] HYDROcodone-acetaminophen (NORCO/VICODIN) 5-325 MG tablet Take 1 tablet by mouth every 6 (six) hours as needed. Take 1 tablet as needed every 6 hours as needed x 3 days  Take 1 tablet as needed every 8 hours x3 days  Take 1 tablet as needed every 12 hours x 3 days Take 1 tablet as  needed daily x 3 days then stop (Patient not taking: Reported on 03/13/2022)   [DISCONTINUED] hydrOXYzine (ATARAX/VISTARIL) 50 MG tablet Take 1 tablet (50 mg total) by mouth at bedtime and may repeat dose one time if needed. For sleep (Patient not taking: Reported on 03/13/2022)   [DISCONTINUED] polyethylene glycol (MIRALAX / GLYCOLAX) 17 g packet Take 17 g by mouth daily as needed for moderate constipation. (Patient not taking: Reported on 03/13/2022)   [DISCONTINUED] XARELTO  10 MG TABS tablet Take 10 mg by mouth daily. (Patient not taking: Reported on 03/13/2022)   No facility-administered encounter medications on file as of 03/13/2022.    Past Medical History:  Diagnosis Date   Anal fissure    Anemia    Bowel obstruction (HCC)    Colon polyps    Crohn disease ([place])    Enteritis    small and large intestine   Fracture of vertebra, thoracic (HCC)    Gallstones    Kidney stones    Renal calculus, left    nonobstructive    Past Surgical History:  Procedure Laterality Date   Abdominal Drain tube Placed     CHOLECYSTECTOMY     LITHOTRIPSY     SMALL INTESTINE SURGERY     with appendectomy   TONSILLECTOMY      Family History  Problem Relation Age of Onset   Colon polyps Father    Diabetes Maternal Grandmother    Colon cancer Maternal Grandfather    Diabetes Paternal Grandmother    Celiac disease Cousin    Crohn's disease Cousin        x 2   Breast cancer Neg Hx     Social History   Socioeconomic History   Marital status: Married    Spouse name: Not on file   Number of children: 4   Years of education: Not on file   Highest education level: Not on file  Occupational History   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  Tobacco Use   Smoking status: Former    Packs/day: 0.25    Years: 40.00    Total pack years: 10.00    Types: Cigarettes    Quit date: 2019    Years since quitting: 5.1   Smokeless tobacco: Never  Vaping Use   Vaping Use: Never used  Substance and Sexual Activity   Alcohol use: Yes    Alcohol/week: 0.0 standard drinks of alcohol    Comment: occasional wine or beer   Drug use: No   Sexual activity: Yes    Partners: Male  Other Topics Concern   Not on file  Social History Narrative   Divorced, has boyfriend   1 son 3 daughtesr - 1984, 88, 90 and 2000   2 caffeine/day   06/23/15   Social Determinants of Health   Financial Resource Strain: Not on file  Food Insecurity: Not on file  Transportation Needs: Not on file  Physical  Activity: Not on file  Stress: Not on file  Social Connections: Not on file  Intimate Partner Violence: Not on file    Review of Systems  Constitutional:  Negative for chills, fever and weight loss.  Eyes:  Negative for blurred vision and double vision.  Respiratory:  Negative for shortness of breath.   Cardiovascular:  Negative for chest pain.  Gastrointestinal:  Negative for abdominal pain, nausea and vomiting.       Reports abdominal bulge with flexing abdomen.   Musculoskeletal:        Fractured T  12 In November 04, 2021.   Neurological:  Negative for dizziness and headaches.  Psychiatric/Behavioral:  Negative for depression and suicidal ideas.         Objective    BP 117/78 (BP Location: Left Arm, Patient Position: Sitting, Cuff Size: Large)   Pulse 87   Temp 98.2 F (36.8 C) (Oral)   Ht '5\' 6"'$  (1.676 m)   Wt 177 lb 12.8 oz (80.6 kg)   SpO2 96%   BMI 28.70 kg/m   Physical Exam Vitals and nursing note reviewed.  Constitutional:      General: She is not in acute distress.    Appearance: Normal appearance.  Cardiovascular:     Rate and Rhythm: Normal rate and regular rhythm.     Heart sounds: Normal heart sounds.  Pulmonary:     Effort: Pulmonary effort is normal.     Breath sounds: Normal breath sounds.  Abdominal:     Palpations: Abdomen is soft.     Tenderness: There is no abdominal tenderness.     Comments: Large firm  bulge on right close to umbilicus when flexing abdomen. Soft when not flexing. No tenderness upon palpation. No changes in skin appearance. No erythema.   Skin:    General: Skin is warm and dry.  Neurological:     General: No focal deficit present.     Mental Status: She is alert. Mental status is at baseline.  Psychiatric:        Mood and Affect: Mood normal.        Behavior: Behavior normal.        Thought Content: Thought content normal.        Judgment: Judgment normal.        Assessment & Plan:   Problem List Items Addressed  This Visit   Establishing care with new doctor, encounter for  Screening for lung cancer   CT low dose lung cancer screening Screening for colon cancer       Cologuard; Future  Abdominal wall bulge -     US Abdomen Complete; Future No pain, no erythema, no warmth. Bulge present with flexing abdomen, otherwise, abdomen is soft to palpation. Symptoms reviewed that warrant seeking higher level of care. Abdominal ultrasound ordered for further evaluation.    Agrees with plan of care discussed.  Questions answered.   Return for cpe due after 06/20/22. Marland Kitchen   Chalmers Guest, FNP

## 2022-03-14 ENCOUNTER — Encounter: Payer: Self-pay | Admitting: Family Medicine

## 2022-03-15 DIAGNOSIS — Z87891 Personal history of nicotine dependence: Secondary | ICD-10-CM | POA: Diagnosis not present

## 2022-03-15 DIAGNOSIS — W132XXD Fall from, out of or through roof, subsequent encounter: Secondary | ICD-10-CM | POA: Diagnosis not present

## 2022-03-15 DIAGNOSIS — S22081D Stable burst fracture of T11-T12 vertebra, subsequent encounter for fracture with routine healing: Secondary | ICD-10-CM | POA: Diagnosis not present

## 2022-03-15 DIAGNOSIS — M85871 Other specified disorders of bone density and structure, right ankle and foot: Secondary | ICD-10-CM | POA: Diagnosis not present

## 2022-03-15 DIAGNOSIS — S92001D Unspecified fracture of right calcaneus, subsequent encounter for fracture with routine healing: Secondary | ICD-10-CM | POA: Diagnosis not present

## 2022-03-15 DIAGNOSIS — Z791 Long term (current) use of non-steroidal anti-inflammatories (NSAID): Secondary | ICD-10-CM | POA: Diagnosis not present

## 2022-03-15 DIAGNOSIS — K5090 Crohn's disease, unspecified, without complications: Secondary | ICD-10-CM | POA: Diagnosis not present

## 2022-03-29 ENCOUNTER — Telehealth: Payer: Self-pay | Admitting: Family Medicine

## 2022-03-29 ENCOUNTER — Ambulatory Visit
Admission: RE | Admit: 2022-03-29 | Discharge: 2022-03-29 | Disposition: A | Discharge status: Home / Self Care | Payer: Commercial Managed Care - PPO | Source: Ambulatory Visit | Attending: Family Medicine | Admitting: Family Medicine

## 2022-03-29 ENCOUNTER — Other Ambulatory Visit: Payer: Self-pay | Admitting: Family Medicine

## 2022-03-29 DIAGNOSIS — R1900 Intra-abdominal and pelvic swelling, mass and lump, unspecified site: Secondary | ICD-10-CM

## 2022-03-29 NOTE — Telephone Encounter (Signed)
Contacted [facility] directly regarding their concern. Per the Tech - the ICD 10 code does not match the order. The order needs to be switched from US Abdomen complete to an US Abdomen limited.   Please send in an updated order. Patient was very upset that they were turned away because the call was not addressed at the time it was received.

## 2022-03-29 NOTE — Progress Notes (Signed)
Ultrasound ordered revised per request of [facility].

## 2022-03-29 NOTE — Telephone Encounter (Signed)
[facility] is calling regarding a patient who is present at facility at this moment and needs clarification on an ultrasound order. Patient needs to leave in half an hour, including imaging time, to return to work. Directed to put telephone note in when requested to transfer. Buel Ream

## 2022-03-29 NOTE — Telephone Encounter (Signed)
Please specify if [facility] imaging called or the pt. I need a detailed message from and contact person from [facility] not the patient.

## 2022-03-29 NOTE — Telephone Encounter (Signed)
Patient had appointment for ultrasound today at [name]. Imaging called to get clarification on order code to determine what they were doing. MA declined to speak to them, stated to put in a note. [facility] called back a few minutes later to notify office and PCP of patient that patient left without being seen and is unhappy. Bailey Lewis

## 2022-03-29 NOTE — Telephone Encounter (Signed)
While on the phone with [facility], the practice that called on behalf of the patient, the Imaging Tech requested a clarification of diagnosis code (complete abdominal versus soft tissue US) from the provider or medical assistant. When informed that I would need to take a message, the patient, who was present with the tech and could hear the conversation, became upset. The Imaging Tech then hung up the phone to discuss with the patient before I could request a spelling of her name and a contact number.  The Imaging Tech then called back five minutes later to notify me (and request I notify the provider) that the patient has left and is very upset.No clarification needed at this time. Provider notified. Buel Ream

## 2022-03-30 DIAGNOSIS — H524 Presbyopia: Secondary | ICD-10-CM | POA: Diagnosis not present

## 2022-03-30 DIAGNOSIS — H2513 Age-related nuclear cataract, bilateral: Secondary | ICD-10-CM | POA: Diagnosis not present

## 2022-04-03 ENCOUNTER — Encounter (HOSPITAL_BASED_OUTPATIENT_CLINIC_OR_DEPARTMENT_OTHER): Payer: Self-pay

## 2022-04-03 ENCOUNTER — Emergency Department (HOSPITAL_BASED_OUTPATIENT_CLINIC_OR_DEPARTMENT_OTHER): Payer: Commercial Managed Care - PPO

## 2022-04-03 ENCOUNTER — Encounter: Payer: Self-pay | Admitting: Family Medicine

## 2022-04-03 ENCOUNTER — Telehealth: Payer: Self-pay | Admitting: Family Medicine

## 2022-04-03 ENCOUNTER — Emergency Department (HOSPITAL_BASED_OUTPATIENT_CLINIC_OR_DEPARTMENT_OTHER)
Admission: EM | Admit: 2022-04-03 | Discharge: 2022-04-03 | Disposition: A | Discharge status: Home / Self Care | Payer: Commercial Managed Care - PPO | Attending: Emergency Medicine | Admitting: Emergency Medicine

## 2022-04-03 ENCOUNTER — Other Ambulatory Visit: Payer: Self-pay

## 2022-04-03 DIAGNOSIS — N281 Cyst of kidney, acquired: Secondary | ICD-10-CM | POA: Diagnosis not present

## 2022-04-03 DIAGNOSIS — K921 Melena: Secondary | ICD-10-CM | POA: Diagnosis present

## 2022-04-03 DIAGNOSIS — K529 Noninfective gastroenteritis and colitis, unspecified: Secondary | ICD-10-CM | POA: Diagnosis not present

## 2022-04-03 DIAGNOSIS — K559 Vascular disorder of intestine, unspecified: Secondary | ICD-10-CM | POA: Diagnosis not present

## 2022-04-03 DIAGNOSIS — K6389 Other specified diseases of intestine: Secondary | ICD-10-CM | POA: Diagnosis not present

## 2022-04-03 LAB — CBC
HCT: 41 % (ref 36.0–46.0)
Hemoglobin: 14.1 g/dL (ref 12.0–15.0)
MCH: 31 pg (ref 26.0–34.0)
MCHC: 34.4 g/dL (ref 30.0–36.0)
MCV: 90.1 fL (ref 80.0–100.0)
Platelets: 161 10*3/uL (ref 150–400)
RBC: 4.55 MIL/uL (ref 3.87–5.11)
RDW: 13 % (ref 11.5–15.5)
WBC: 6.6 10*3/uL (ref 4.0–10.5)
nRBC: 0 % (ref 0.0–0.2)

## 2022-04-03 LAB — COMPREHENSIVE METABOLIC PANEL
ALT: 11 U/L (ref 0–44)
AST: 22 U/L (ref 15–41)
Albumin: 4 g/dL (ref 3.5–5.0)
Alkaline Phosphatase: 104 U/L (ref 38–126)
Anion gap: 12 (ref 5–15)
BUN: 11 mg/dL (ref 8–23)
CO2: 21 mmol/L — ABNORMAL LOW (ref 22–32)
Calcium: 9.3 mg/dL (ref 8.9–10.3)
Chloride: 104 mmol/L (ref 98–111)
Creatinine, Ser: 0.64 mg/dL (ref 0.44–1.00)
GFR, Estimated: >60 mL/min (ref >60)
Glucose, Bld: 97 mg/dL (ref 70–99)
Potassium: 4.1 mmol/L (ref 3.5–5.1)
Sodium: 137 mmol/L (ref 135–145)
Total Bilirubin: 0.6 mg/dL (ref 0.3–1.2)
Total Protein: 7.4 g/dL (ref 6.5–8.1)

## 2022-04-03 LAB — LIPASE, BLOOD: Lipase: 19 U/L (ref 11–51)

## 2022-04-03 LAB — LACTIC ACID, PLASMA: Lactic Acid, Venous: 1.7 mmol/L (ref 0.5–1.9)

## 2022-04-03 MED ORDER — IOHEXOL 300 MG/ML  SOLN
100.0000 mL | Freq: Once | INTRAMUSCULAR | Status: AC | PRN
  Administered 2022-04-03: 85 mL via INTRAVENOUS

## 2022-04-03 MED ORDER — LACTATED RINGERS IV BOLUS
1000.0000 mL | Freq: Once | INTRAVENOUS | Status: AC
  Administered 2022-04-03: 1000 mL via INTRAVENOUS

## 2022-04-03 NOTE — Discharge Instructions (Signed)
Be sure to drink plenty of fluids.  It seems that you are having inflammation of her colon due to low blood flow.  For now eat a softer/more liquid diet until your symptoms clear.  You might still have some blood in your stool though which is started to darken and lessen.  If your pain worsens, you develop more or worsening blood in the stool, or any other new/concerning symptoms then return to the ER or call 911.

## 2022-04-03 NOTE — Telephone Encounter (Signed)
Telephone encounter to [facility].

## 2022-04-03 NOTE — ED Provider Notes (Signed)
[facility] Provider Note   CSN: [id number] Arrival date & time: 04/03/22  1440     History  Chief Complaint  Patient presents with   Blood [place] is a 62 y.o. female.  HPI 62 year old female with a history of Crohn's disease presents with bright red blood in the stool.  She states that she had a little bit of abdominal discomfort last night.  Thought it might be from the salad she had at lunch.  This morning after waking up she had a bowel movement that had stool but also a large amount of blood in the toilet.  Denies any significant abdominal pain today.  Around noon she had another bowel movement with blood and messaged her doctor told her to come to the ER.  Right now she feels fine including no dizziness, lightheadedness, chest pain, shortness of breath, abdominal pain.  No vomiting. She is not on blood thinners. No rectal pain.   From a Crohn's disease perspective, she had to have surgery for a bowel obstruction about 3 years ago at an outside facility.  Since then she has not had any issues and has not been on any type of medication for Crohn's disease.  Home Medications Prior to Admission medications   Medication Sig Start Date End Date Taking? Authorizing Provider  calcium carbonate (OS-CAL) 1250 (500 Ca) MG chewable tablet Chew 1 tablet by mouth daily. 11/20/21 02/18/22  [provider]  Cholecalciferol (VITAMIN D3) 25 MCG (1000 UT) tablet Take 1 tablet by mouth daily.    [provider]      Allergies    Asa [aspirin] and Reglan [metoclopramide]    Review of Systems   Review of Systems  Constitutional:  Negative for fever.  Respiratory:  Negative for shortness of breath.   Cardiovascular:  Negative for chest pain.  Gastrointestinal:  Positive for abdominal pain and blood in stool. Negative for nausea, rectal pain and vomiting.  Neurological:  Negative for light-headedness.     Physical Exam Updated Vital Signs BP 111/79   Pulse 74   Temp 97.8 F (36.6 C)   Resp 18   SpO2 100%  Physical Exam Vitals and nursing note reviewed.  Constitutional:      Appearance: She is well-developed.  HENT:     Head: Normocephalic and atraumatic.  Cardiovascular:     Rate and Rhythm: Normal rate and regular rhythm.     Heart sounds: Normal heart sounds.  Pulmonary:     Effort: Pulmonary effort is normal.     Breath sounds: Normal breath sounds.  Abdominal:     Palpations: Abdomen is soft.     Tenderness: There is abdominal tenderness.     Comments: Patient is diffusely tender but much more tender on the right upper quadrant and right lower quadrant.  Soft abdomen.  Appears to have an incisional mid abdominal hernia.  No incarceration.  Skin:    General: Skin is warm and dry.  Neurological:     Mental Status: She is alert.     ED Results / Procedures / Treatments   Labs (all labs ordered are listed, but only abnormal results are displayed) Labs Reviewed  COMPREHENSIVE METABOLIC PANEL - Abnormal; Notable for the following components:      Result Value   CO2 21 (*)    All other components within normal limits  LIPASE, BLOOD  CBC  LACTIC ACID, PLASMA    EKG  None  Radiology CT ABDOMEN PELVIS W CONTRAST  Result Date: 04/03/2022 CLINICAL DATA:  Bright red blood in toilet EXAM: CT ABDOMEN AND PELVIS WITH CONTRAST TECHNIQUE: Multidetector CT imaging of the abdomen and pelvis was performed using the standard protocol following bolus administration of intravenous contrast. RADIATION DOSE REDUCTION: This exam was performed according to the departmental dose-optimization program which includes automated exposure control, adjustment of the mA and/or kV according to patient size and/or use of iterative reconstruction technique. CONTRAST:  71mL OMNIPAQUE IOHEXOL 300 MG/ML  SOLN COMPARISON:  CT 06/30/2015 FINDINGS: Lower chest: Lung bases are clear. Hepatobiliary: No  focal liver abnormality is seen. Status post cholecystectomy. No biliary dilatation. Pancreas: Unremarkable. No pancreatic ductal dilatation or surrounding inflammatory changes. Spleen: Normal in size without focal abnormality. Adrenals/Urinary Tract: Adrenal glands are normal. Kidneys show no hydronephrosis. Cyst in the right kidney, no imaging follow-up is recommended. The bladder is normal Stomach/Bowel: The stomach is nonenlarged. No dilated small bowel. Postsurgical changes at the ileocecal region. Fluid in the colon. Mild wall thickening and mucosal enhancement involving the sigmoid colon and distal descending colon. Vascular/Lymphatic: Mild aortic atherosclerosis. No aneurysm. No suspicious lymph nodes Reproductive: Uterus and bilateral adnexa are unremarkable. Other: Negative for pelvic effusion or free air. Diastasis of the rectus sheath. Musculoskeletal: Possible subacute severe compression fracture of T12 with about 6 mm retropulsion. Superior endplate deformity at L4 with Schmorl's node. IMPRESSION: 1. Mild wall thickening and mucosal enhancement involving the distal descending colon and sigmoid colon consistent with colitis of infectious, inflammatory, or ischemic etiology. 2. Possible subacute severe compression fracture of T12 with about 6 mm retropulsion. Correlate for point tenderness. 1 3. Aortic atherosclerosis. Aortic Atherosclerosis (ICD10-I70.0). Electronically Signed   By: Donavan Foil M.D.   On: 04/03/2022 17:07    Procedures Procedures    Medications Ordered in ED Medications  lactated ringers bolus 1,000 mL (0 mLs Intravenous Stopped 04/03/22 1903)  iohexol (OMNIPAQUE) 300 MG/ML solution 100 mL (85 mLs Intravenous Contrast Given 04/03/22 1642)  lactated ringers bolus 1,000 mL (1,000 mLs Intravenous New Bag/Given 04/03/22 1755)    ED Course/ Medical Decision Making/ A&P                             Medical Decision Making Amount and/or Complexity of Data Reviewed External  Data Reviewed: notes. Labs: ordered.    Details: Normal WBC, normal hemoglobin and normal lactate Radiology: ordered and independent interpretation performed.    Details: CT with colitis.  Risk Prescription drug management.   Patient overall appears well.  Vital signs are okay.  She has some tenderness but does not seem to need pain medicine.  Labs are reassuring.  CT does show what is probably ischemic colitis.  I discussed with GI, Dr. Henrene Pastor, who recommends fluids but if she is doing okay can follow-up as an outpatient.  Has not had a colonoscopy yet/will need this as well.  Otherwise, the patient tolerated fluids well and will recommend a soft diet and expectant management and follow-up with GI.  I doubt this is infectious at this time.  She has not had any bowel movements for the last several hours while being in the emergency department.  Will give return precautions.        Final Clinical Impression(s) / ED Diagnoses Final diagnoses:  Ischemic colitis ([place])    Rx / DC Orders ED Discharge Orders          Ordered  Ambulatory referral to Gastroenterology        04/03/22 1813              Sherwood Gambler, MD 04/03/22 [phone]

## 2022-04-03 NOTE — ED Triage Notes (Signed)
Pt c/o bright red blood in toilet x2 this AM. States she ate a salad last night but unsure if related. Hx of crohns, "but it's been 25+ years since I've had issues w blood like that." Denies feeling lightheaded/ dizzy, near-syncopal episodes.

## 2022-04-04 ENCOUNTER — Encounter: Payer: Self-pay | Admitting: Family Medicine

## 2022-04-05 ENCOUNTER — Telehealth: Payer: Self-pay | Admitting: Internal Medicine

## 2022-04-05 NOTE — Telephone Encounter (Signed)
Good afternoon Dr. Carlean Purl,  We received a call from this patient wishing to schedule a colonoscopy due to some rectal bleeding and a recent ED visit she had. Patient however would like a Transfer of Care to Dr. Rush Landmark because she stated he had been recommended. She was last seen in 2017 with you. Would you be willing to accept this transfer?  Thank you.

## 2022-04-09 NOTE — Telephone Encounter (Signed)
Sure

## 2022-04-10 NOTE — Telephone Encounter (Signed)
Will review when I return to clinic this week. GM

## 2022-04-10 NOTE — Telephone Encounter (Signed)
Patient's chart reviewed. Previous possible history of Crohn's disease though documentation did not end up showing that. She was offered a colonoscopy years ago but deferred on it. Okay to except transfer of care. This patient can be scheduled for a colonoscopy within the next 3 to 6 weeks. She needs to be seen in clinic before she can be seen by one of the APP's (and I can supervise) or myself. Thanks. GM

## 2022-04-11 ENCOUNTER — Encounter: Payer: Self-pay | Admitting: Physician Assistant

## 2022-04-13 ENCOUNTER — Inpatient Hospital Stay: Admission: RE | Admit: 2022-04-13 | Payer: Commercial Managed Care - PPO | Source: Ambulatory Visit

## 2022-04-20 ENCOUNTER — Other Ambulatory Visit: Payer: Commercial Managed Care - PPO

## 2022-05-18 ENCOUNTER — Other Ambulatory Visit: Payer: Commercial Managed Care - PPO

## 2022-06-01 ENCOUNTER — Other Ambulatory Visit (HOSPITAL_COMMUNITY): Payer: Self-pay

## 2022-06-01 ENCOUNTER — Ambulatory Visit: Payer: Commercial Managed Care - PPO | Admitting: Physician Assistant

## 2022-06-01 ENCOUNTER — Encounter: Payer: Self-pay | Admitting: Physician Assistant

## 2022-06-01 ENCOUNTER — Other Ambulatory Visit (INDEPENDENT_AMBULATORY_CARE_PROVIDER_SITE_OTHER): Payer: Commercial Managed Care - PPO

## 2022-06-01 VITALS — BP 122/70 | HR 86 | Ht 66.0 in | Wt 183.0 lb

## 2022-06-01 DIAGNOSIS — K625 Hemorrhage of anus and rectum: Secondary | ICD-10-CM

## 2022-06-01 DIAGNOSIS — K529 Noninfective gastroenteritis and colitis, unspecified: Secondary | ICD-10-CM | POA: Diagnosis not present

## 2022-06-01 DIAGNOSIS — Z8719 Personal history of other diseases of the digestive system: Secondary | ICD-10-CM | POA: Diagnosis not present

## 2022-06-01 DIAGNOSIS — R933 Abnormal findings on diagnostic imaging of other parts of digestive tract: Secondary | ICD-10-CM | POA: Diagnosis not present

## 2022-06-01 DIAGNOSIS — M6208 Separation of muscle (nontraumatic), other site: Secondary | ICD-10-CM | POA: Diagnosis not present

## 2022-06-01 LAB — SEDIMENTATION RATE: Sed Rate: 20 mm/hr (ref 0–30)

## 2022-06-01 LAB — C-REACTIVE PROTEIN: CRP: <1 mg/dL (ref 0.5–20.0)

## 2022-06-01 MED ORDER — NA SULFATE-K SULFATE-MG SULF 17.5-3.13-1.6 GM/177ML PO SOLN
1.0000 | ORAL | 0 refills | Status: DC
  Filled 2022-06-01: qty 354, 1d supply, fill #0

## 2022-06-01 NOTE — Patient Instructions (Addendum)
Your provider has requested that you go to the basement level for lab work before leaving today. Press "B" on the elevator. The lab is located at the first door on the left as you exit the elevator.  We have sent the following medications to your pharmacy for you to pick up at your convenience: Suprep   You have been scheduled for a colonoscopy. Please follow written instructions given to you at your visit today.  Please pick up your prep supplies at the pharmacy within the next 1-3 days. If you use inhalers (even only as needed), please bring them with you on the day of your procedure.  Due to recent changes in healthcare laws, you may see the results of your imaging and laboratory studies on MyChart before your provider has had a chance to review them.  We understand that in some cases there may be results that are confusing or concerning to you. Not all laboratory results come back in the same time frame and the provider may be waiting for multiple results in order to interpret others.  Please give [place] 48 hours in order for your provider to thoroughly review all the results before contacting the office for clarification of your results.   _______________________________________________________  If your blood pressure at your visit was 140/90 or greater, please contact your primary care physician to follow up on this.  _______________________________________________________  If you are age 63 or older, your body mass index should be between 23-30. Your Body mass index is 29.54 kg/m. If this is out of the aforementioned range listed, please consider follow up with your Primary Care Provider.  If you are age 79 or younger, your body mass index should be between 19-25. Your Body mass index is 29.54 kg/m. If this is out of the aformentioned range listed, please consider follow up with your Primary Care Provider.   ________________________________________________________  The [facility]  providers would like to encourage you to use [facility] to communicate with providers for non-urgent requests or questions.  Due to long hold times on the telephone, sending your provider a message by [facility] may be a faster and more efficient way to get a response.  Please allow 48 business hours for a response.  Please remember that this is for non-urgent requests.  _______________________________________________________  Thank you for choosing me and [facility].  Amy Esterwood PA-C

## 2022-06-06 ENCOUNTER — Encounter: Payer: Self-pay | Admitting: Physician Assistant

## 2022-06-06 NOTE — Progress Notes (Signed)
Subjective:    Patient ID: Bailey Lewis, female    DOB: 1960/03/31, 62 y.o.   MRN: [id number]  HPI  Bailey Lewis  is a 62 year old female, seen today after recent ER visit on 04/03/2022.  She is established with Dr. Leone Lewis and had last been seen in 2017.  At that time she was scheduled for a colonoscopy but did not follow through with that and we have not seen her since then. She has history of Crohn's disease diagnosed in the early 1990s when she was living in [place].  She was treated for a period of time with Remicade, then Humira.  Per prior notes she had undergone a small bowel resection x 1, abscess drainage x 1 and had repeated hospitalizations for partial small bowel obstructions.  When she was seen in 2017 she had not been on any maintenance therapy and admitted that she had self discontinued most of the medications in the past when she was feeling well.  At that time she had come in with diarrhea, and colonoscopy was scheduled which she did not have done. Today she says that she had surgery about 4 years ago while she was living in [place] at [facility].  This was for a partial obstruction and she had lysis of adhesions she does not think she had any bowel resected. A couple of months ago she had an episode of bleeding which she felt was a lot of dark red blood.  It occurred again on that same day and she presented to the emergency room. She also relates that she has been having chronic problems with diarrhea, but feels that something has changed over the past few months.  She is having at least 4 bowel movements each morning, but still feels that she does not evacuate her bowel well.  She has had a couple other episodes of small-volume blood per rectum.  She will usually have 2 or 3 bowel movements later in the day as well. During ER evaluation CT of the abdomen and pelvis was done which showed mild wall thickening of the distal descending/sigmoid colon consistent with a colitis/ischemic versus  inflammatory.  Also noted a subacute compression fracture T12 with slight retropulsion. Labs show WBC of 6.6 hemoglobin 14.1/hematocrit 41.0 LFTs within normal limits, and lipase normal. She was not treated with any specific medications and was asked to follow-up with GI.  Patient also mentions that she had a bad accident in October 2023 when she fell from her attic at home to the garage floor.  It sounds as if she had a body cast for a period of time and has since recovered but still has back pain.  She noticed a diastases of her abdominal wall after the body cast came off. She does not have any known cardiac disease, no blood thinners, no sleep apnea or oxygen use.     Review of Systems Pertinent positive and negative review of systems were noted in the above HPI section.  All other review of systems was otherwise negative.   Outpatient Encounter Medications as of 06/01/2022  Medication Sig   Cholecalciferol (VITAMIN D3) 25 MCG (1000 UT) tablet Take 1 tablet by mouth daily.   Na Sulfate-K Sulfate-Mg Sulf (SUPREP BOWEL PREP KIT) 17.5-3.13-1.6 GM/177ML SOLN Take 1 kit by mouth as directed. For colonoscopy prep   NON FORMULARY Vitamin for hair and nail   calcium carbonate (OS-CAL) 1250 (500 Ca) MG chewable tablet Chew 1 tablet by mouth daily.   No facility-administered encounter medications  on file as of 06/01/2022.   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/05/2020   Other insomnia 08/05/2020   Crohn's disease with complication (HCC) 08/05/2020   Screening for colon cancer 08/05/2020   Vitamin D deficiency 08/05/2020   Psoriasis 07/29/2019   Social History   Socioeconomic History    Marital status: Married    Spouse name: Not on file   Number of children: 4   Years of education: Not on file   Highest education level: Not on file  Occupational History   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   Occupation: medical receptionist  Tobacco Use   Smoking status: Former    Packs/day: 0.25    Years: 40.00    Additional pack years: 0.00    Total pack years: 10.00    Types: Cigarettes    Quit date: 2019    Years since quitting: 5.4   Smokeless tobacco: Never  Vaping Use   Vaping Use: Never used  Substance and Sexual Activity   Alcohol use: Yes    Alcohol/week: 0.0 standard drinks of alcohol    Comment: occasional wine or beer   Drug use: No   Sexual activity: Yes    Partners: Male  Other Topics Concern   Not on file  Social History Narrative   Divorced, has boyfriend   1 son 3 daughtesr - 1984, 31, 3 and 2000   2 caffeine/day   06/23/15   Social Determinants of Health   Financial Resource Strain: Not on file  Food Insecurity: Not on file  Transportation Needs: Not on file  Physical Activity: Not on file  Stress: Not on file  Social Connections: Not on file  Intimate Partner Violence: Not on file    Bailey Lewis's family history includes Celiac disease in her cousin; Colon cancer in her maternal grandfather; Colon polyps in her father; Crohn's disease in her cousin; Diabetes in her maternal grandmother and paternal grandmother.      Objective:    Vitals:   06/01/22 1421  BP: 122/70  Pulse: 86    Physical Exam. Well-developed well-nourished  older WF  in no acute distress.  Height, Weight, 183 BMI 29.54  HEENT; nontraumatic normocephalic, EOMI, PE R LA, sclera anicteric. Oropharynx;not done Neck; supple, no JVD Cardiovascular; regular rate and rhythm with S1-S2, no murmur rub or gallop Pulmonary; Clear bilaterally Abdomen; soft, nontender, nondistended, no palpable mass or hepatosplenomegaly,  there is a diastasis periumbilical  area ,bowel sounds are  active Rectal;not done  Skin; benign exam, no jaundice rash or appreciable lesions Extremities; no clubbing cyanosis or edema skin warm and dry Neuro/Psych; alert and oriented x4, grossly nonfocal mood and affect appropriate        Assessment & Plan:   #34 62 year old female with previous history of Crohn's disease initially diagnosed in the early 68s while she was living in [place], had been treated for period of time with Remicade, then Humira, but had not been on any maintenance medication for some time when she was initially seen here in 2017. By report she has prior history of bowel obstructions, and at least 1 bowel resection. When she was seen in 2017 she was scheduled for  colonoscopy to reassess extent of disease but she did not follow through with that. She comes in today after an ER visit on 04/03/2022 with abdominal pain, diarrhea and blood in stool.  Labs were reassuring, CT of the abdomen pelvis did show some mild wall thickening of the distal descending/sigmoid colon consistent with an inflammatory versus ischemic colitis, and also noted a subacute compression fracture of T12.  Patient reports that she had a couple of episodes of bright red blood per rectum towards the end of March which is why she went to the emergency room, since then it has occurred a couple of times but not in the past month and was less blood than previous.  She continues to have chronic issues with diarrhea usually having at least up to 10 bowel movements per day.  Unclear at this time whether her chronic diarrhea is secondary to Crohn's disease, versus IBS-D versus other.  She does have evidence of a mild left-sided colitis on recent CT, and needs to have colonoscopy for reassessment of activity and extent of disease.  #2  diastases recti/mid abdomen  Plan; check sed rate, CRP and fecal Calprotectin Patient will be scheduled for colonoscopy with Dr. Leone Lewis.  Procedure was discussed in detail with the  patient including indications risks and benefits and she is agreeable to proceed. Further recommendations pending findings at colonoscopy.  Nayah Lukens Oswald Hillock PA-C 06/06/2022   Cc: Novella Olive, FNP

## 2022-06-11 ENCOUNTER — Other Ambulatory Visit (HOSPITAL_COMMUNITY): Payer: Self-pay

## 2022-06-18 DIAGNOSIS — H25812 Combined forms of age-related cataract, left eye: Secondary | ICD-10-CM | POA: Diagnosis not present

## 2022-06-18 DIAGNOSIS — Z961 Presence of intraocular lens: Secondary | ICD-10-CM | POA: Diagnosis not present

## 2022-06-18 DIAGNOSIS — H2512 Age-related nuclear cataract, left eye: Secondary | ICD-10-CM | POA: Diagnosis not present

## 2022-06-22 ENCOUNTER — Encounter: Payer: Self-pay | Admitting: Family Medicine

## 2022-06-22 ENCOUNTER — Ambulatory Visit: Admission: RE | Admit: 2022-06-22 | Payer: Commercial Managed Care - PPO | Source: Ambulatory Visit

## 2022-06-22 ENCOUNTER — Ambulatory Visit (INDEPENDENT_AMBULATORY_CARE_PROVIDER_SITE_OTHER): Payer: Commercial Managed Care - PPO | Admitting: Family Medicine

## 2022-06-22 ENCOUNTER — Other Ambulatory Visit (HOSPITAL_COMMUNITY): Payer: Self-pay

## 2022-06-22 VITALS — BP 118/72 | HR 75 | Temp 98.1°F | Resp 20 | Ht 66.0 in | Wt 184.7 lb

## 2022-06-22 DIAGNOSIS — J3489 Other specified disorders of nose and nasal sinuses: Secondary | ICD-10-CM | POA: Diagnosis not present

## 2022-06-22 DIAGNOSIS — Z6829 Body mass index (BMI) 29.0-29.9, adult: Secondary | ICD-10-CM | POA: Insufficient documentation

## 2022-06-22 DIAGNOSIS — R5382 Chronic fatigue, unspecified: Secondary | ICD-10-CM | POA: Insufficient documentation

## 2022-06-22 DIAGNOSIS — Z0000 Encounter for general adult medical examination without abnormal findings: Secondary | ICD-10-CM

## 2022-06-22 DIAGNOSIS — Z13220 Encounter for screening for lipoid disorders: Secondary | ICD-10-CM

## 2022-06-22 MED ORDER — MUPIROCIN 2 % EX OINT
1.0000 | TOPICAL_OINTMENT | Freq: Two times a day (BID) | CUTANEOUS | 0 refills | Status: DC
  Filled 2022-06-22: qty 22, 11d supply, fill #0

## 2022-06-22 NOTE — Progress Notes (Signed)
Complete physical exam  Patient: Bailey Lewis   DOB: 1960-10-14   62 y.o. Female  MRN: [id number]  Subjective:    Chief Complaint  Patient presents with   Annual Exam    Patient is here for her annual physical, she states her only concerns are with her nose, she states that she has had this problem for a while both nostrils become infected.     Bailey Lewis is a 62 y.o. female who presents today for a complete physical exam. She reports consuming a general diet.  Walking some.   She generally feels well. She reports sleeping fairly well. She does have additional problems to discuss today.    Nostril "infection": bilateral nostril soreness constantly, this has been present for "along time". Reports crusting and bleeding at times.   Most recent fall risk assessment:    03/13/2022    4:46 PM  Fall Risk   Falls in the past year? 1  Number falls in past yr: 0  Injury with Fall? 1  Risk for fall due to : History of fall(s);Impaired mobility  Follow up Falls evaluation completed     Most recent depression screenings:    03/13/2022    4:47 PM 08/05/2020    1:56 PM  PHQ 2/9 Scores  PHQ - 2 Score 1 0  PHQ- 9 Score 8 5  Denies depression, no thoughts of self-harm.   Vision:Within last year and Dental: No current dental problems and No regular dental care     Patient Care Team: Novella Olive, FNP as PCP - General (Family Medicine)   Outpatient Medications Prior to Visit  Medication Sig   Na Sulfate-K Sulfate-Mg Sulf (SUPREP BOWEL PREP KIT) 17.5-3.13-1.6 GM/177ML SOLN Take 1 kit by mouth as directed. For colonoscopy prep   naproxen sodium (ALEVE) 220 MG tablet Take 220 mg by mouth daily as needed.   calcium carbonate (OS-CAL) 1250 (500 Ca) MG chewable tablet Chew 1 tablet by mouth daily.   Cholecalciferol (VITAMIN D3) 25 MCG (1000 UT) tablet Take 1 tablet by mouth daily. (Patient not taking: Reported on 06/22/2022)   NON FORMULARY Vitamin for hair and nail (Patient not  taking: Reported on 06/22/2022)   No facility-administered medications prior to visit.    Review of Systems  Constitutional:  Positive for malaise/fatigue. Negative for weight loss.  Eyes:  Negative for blurred vision and double vision.  Respiratory:  Negative for shortness of breath.   Cardiovascular:  Negative for chest pain.  Gastrointestinal:  Positive for abdominal pain (has crohns). Negative for nausea and vomiting.  Neurological:  Negative for dizziness and headaches.  Psychiatric/Behavioral:  Negative for depression and suicidal ideas.           Objective:     BP 118/72   Pulse 75   Temp 98.1 F (36.7 C) (Oral)   Resp 20   Ht 5\' 6"  (1.676 m)   Wt 184 lb 11.2 oz (83.8 kg)   SpO2 96%   BMI 29.81 kg/m    Physical Exam Vitals and nursing note reviewed.  Constitutional:      General: She is not in acute distress.    Appearance: Normal appearance.  HENT:     Right Ear: Tympanic membrane normal.     Left Ear: Tympanic membrane normal.     Nose:     Comments: Erythema inside of bilateral nostrils. Neck:     Thyroid: No thyroid tenderness.  Cardiovascular:     Rate and Rhythm:  Normal rate and regular rhythm.     Pulses: Normal pulses.     Heart sounds: Normal heart sounds.  Pulmonary:     Effort: Pulmonary effort is normal.     Breath sounds: Normal breath sounds.  Abdominal:     General: Bowel sounds are normal.     Palpations: Abdomen is soft.     Tenderness: There is abdominal tenderness (generalized, has crohns).  Musculoskeletal:     Right lower leg: No edema.     Left lower leg: No edema.  Lymphadenopathy:     Cervical:     Right cervical: No superficial cervical adenopathy.    Left cervical: No superficial cervical adenopathy.  Skin:    General: Skin is warm and dry.     Capillary Refill: Capillary refill takes less than 2 seconds.  Neurological:     General: No focal deficit present.     Mental Status: She is alert. Mental status is at  baseline.  Psychiatric:        Mood and Affect: Mood normal.        Behavior: Behavior normal.        Thought Content: Thought content normal.        Judgment: Judgment normal.      No results found for any visits on 06/22/22.     Assessment & Plan:    Routine Health Maintenance and Physical Exam  Immunization History  Administered Date(s) Administered   Influenza-Unspecified 10/23/2021   PFIZER(Purple Top)SARS-COV-2 Vaccination 03/26/2019, 04/17/2019, 11/19/2019    Health Maintenance  Topic Date Due   DTaP/Tdap/Td (1 - Tdap) Never done   Colonoscopy  Never done   Zoster Vaccines- Shingrix (1 of 2) Never done   COVID-19 Vaccine (4 - 2023-24 season) 09/08/2021   INFLUENZA VACCINE  08/09/2022   MAMMOGRAM  06/16/2023   PAP SMEAR-Modifier  07/26/2024   HPV VACCINES  Aged Out   Hepatitis C Screening  Discontinued   HIV Screening  Discontinued    Discussed health benefits of physical activity, and encouraged her to engage in regular exercise appropriate for her age and condition.  Problem List Items Addressed This Visit     Encounter for lipid screening for cardiovascular disease - Primary   Relevant Orders   Lipid panel   BMI 29.0-29.9,adult   Relevant Orders   Hemoglobin A1c   TSH + free T4   Chronic fatigue   Relevant Orders   TSH + free T4   Nostril infection   Relevant Medications   mupirocin ointment (BACTROBAN) 2 %  Agrees with plan of care discussed.  Questions answered. She will let me know if mupirocin does not help nostril infection.    Return in about 1 year (around 06/22/2023) for cpe.     Novella Olive, FNP

## 2022-06-25 DIAGNOSIS — R5382 Chronic fatigue, unspecified: Secondary | ICD-10-CM | POA: Diagnosis not present

## 2022-06-25 DIAGNOSIS — Z136 Encounter for screening for cardiovascular disorders: Secondary | ICD-10-CM | POA: Diagnosis not present

## 2022-06-25 DIAGNOSIS — Z13220 Encounter for screening for lipoid disorders: Secondary | ICD-10-CM | POA: Diagnosis not present

## 2022-06-25 DIAGNOSIS — Z6829 Body mass index (BMI) 29.0-29.9, adult: Secondary | ICD-10-CM | POA: Diagnosis not present

## 2022-06-26 LAB — LIPID PANEL
Chol/HDL Ratio: 1.8 ratio (ref 0.0–4.4)
Cholesterol, Total: 168 mg/dL (ref 100–199)
HDL: 91 mg/dL (ref >39)
LDL Chol Calc (NIH): 60 mg/dL (ref 0–99)
Triglycerides: 98 mg/dL (ref 0–149)
VLDL Cholesterol Cal: 17 mg/dL (ref 5–40)

## 2022-06-26 LAB — HEMOGLOBIN A1C
Est. average glucose Bld gHb Est-mCnc: 108 mg/dL
Hgb A1c MFr Bld: 5.4 % (ref 4.8–5.6)

## 2022-06-26 LAB — TSH+FREE T4
Free T4: 1.38 ng/dL (ref 0.82–1.77)
TSH: 2.21 u[IU]/mL (ref 0.450–4.500)

## 2022-07-06 ENCOUNTER — Encounter: Payer: Commercial Managed Care - PPO | Admitting: Internal Medicine

## 2022-07-19 ENCOUNTER — Other Ambulatory Visit: Payer: Self-pay | Admitting: Nurse Practitioner

## 2022-07-19 ENCOUNTER — Other Ambulatory Visit: Payer: Self-pay | Admitting: Family Medicine

## 2022-07-19 ENCOUNTER — Ambulatory Visit
Admission: RE | Admit: 2022-07-19 | Discharge: 2022-07-19 | Disposition: A | Discharge status: Home / Self Care | Payer: Commercial Managed Care - PPO | Source: Ambulatory Visit | Attending: Nurse Practitioner | Admitting: Nurse Practitioner

## 2022-07-19 DIAGNOSIS — Z1231 Encounter for screening mammogram for malignant neoplasm of breast: Secondary | ICD-10-CM

## 2022-08-09 DIAGNOSIS — H2511 Age-related nuclear cataract, right eye: Secondary | ICD-10-CM | POA: Diagnosis not present

## 2022-08-09 DIAGNOSIS — H25811 Combined forms of age-related cataract, right eye: Secondary | ICD-10-CM | POA: Diagnosis not present

## 2022-08-09 DIAGNOSIS — Z961 Presence of intraocular lens: Secondary | ICD-10-CM | POA: Diagnosis not present

## 2022-08-24 ENCOUNTER — Encounter: Payer: Commercial Managed Care - PPO | Admitting: Internal Medicine

## 2022-08-27 DIAGNOSIS — Z6831 Body mass index (BMI) 31.0-31.9, adult: Secondary | ICD-10-CM | POA: Diagnosis not present

## 2022-08-27 DIAGNOSIS — Z01419 Encounter for gynecological examination (general) (routine) without abnormal findings: Secondary | ICD-10-CM | POA: Diagnosis not present

## 2022-08-28 ENCOUNTER — Other Ambulatory Visit: Payer: Self-pay | Admitting: Obstetrics and Gynecology

## 2022-08-28 DIAGNOSIS — Z720 Tobacco use: Secondary | ICD-10-CM

## 2022-09-14 ENCOUNTER — Ambulatory Visit
Admission: RE | Admit: 2022-09-14 | Discharge: 2022-09-14 | Disposition: A | Discharge status: Home / Self Care | Payer: Commercial Managed Care - PPO | Source: Ambulatory Visit | Attending: Obstetrics and Gynecology | Admitting: Obstetrics and Gynecology

## 2022-09-14 DIAGNOSIS — Z720 Tobacco use: Secondary | ICD-10-CM

## 2022-09-14 DIAGNOSIS — Z87891 Personal history of nicotine dependence: Secondary | ICD-10-CM | POA: Diagnosis not present

## 2022-12-11 ENCOUNTER — Ambulatory Visit: Payer: Self-pay

## 2022-12-12 ENCOUNTER — Ambulatory Visit: Payer: Self-pay

## 2022-12-19 ENCOUNTER — Ambulatory Visit: Payer: Commercial Managed Care - PPO | Admitting: Cardiology

## 2023-03-12 ENCOUNTER — Ambulatory Visit: Payer: Commercial Managed Care - PPO | Admitting: Cardiology

## 2023-03-15 ENCOUNTER — Other Ambulatory Visit (HOSPITAL_COMMUNITY): Payer: Self-pay

## 2023-03-15 ENCOUNTER — Ambulatory Visit: Admitting: Cardiology

## 2023-03-15 ENCOUNTER — Encounter: Payer: Self-pay | Admitting: Cardiology

## 2023-03-15 VITALS — BP 118/80 | HR 78 | Ht 66.0 in | Wt 195.1 lb

## 2023-03-15 DIAGNOSIS — I7090 Unspecified atherosclerosis: Secondary | ICD-10-CM | POA: Diagnosis not present

## 2023-03-15 DIAGNOSIS — Z7689 Persons encountering health services in other specified circumstances: Secondary | ICD-10-CM

## 2023-03-15 DIAGNOSIS — Z87891 Personal history of nicotine dependence: Secondary | ICD-10-CM

## 2023-03-15 DIAGNOSIS — R9431 Abnormal electrocardiogram [ECG] [EKG]: Secondary | ICD-10-CM

## 2023-03-15 DIAGNOSIS — I2510 Atherosclerotic heart disease of native coronary artery without angina pectoris: Secondary | ICD-10-CM | POA: Diagnosis not present

## 2023-03-15 DIAGNOSIS — R072 Precordial pain: Secondary | ICD-10-CM | POA: Diagnosis not present

## 2023-03-15 DIAGNOSIS — Z01812 Encounter for preprocedural laboratory examination: Secondary | ICD-10-CM

## 2023-03-15 MED ORDER — METOPROLOL TARTRATE 100 MG PO TABS
ORAL_TABLET | ORAL | 0 refills | Status: DC
  Filled 2023-03-15: qty 1, 1d supply, fill #0

## 2023-03-15 NOTE — Patient Instructions (Addendum)
 Medication Instructions:  Your physician recommends that you continue on your current medications as directed. Please refer to the Current Medication list given to you today.  *If you need a refill on your cardiac medications before your next appointment, please call your pharmacy*   Lab Work: Lp(a), CMET, Mag If you have labs (blood work) drawn today and your tests are completely normal, you will receive your results only by: MyChart Message (if you have MyChart) OR A paper copy in the mail If you have any lab test that is abnormal or we need to change your treatment, we will call you to review the results.   Testing/Procedures:   Your cardiac CT will be scheduled at one of the below locations:   [facility] [address] [place], [place] [address] [phone]   If scheduled at [facility], please arrive at the [facility] Entrance (Entrance C2) of [facility] 30 minutes prior to test start time. You can use the FREE valet parking offered at entrance C (encouraged to control the heart rate for the test)  Proceed to the [facility] Radiology Department (first floor) to check-in and test prep.  All radiology patients and guests should use entrance C2 at [facility], accessed from [facility], even though the hospital's physical address listed is [address].    Please follow these instructions carefully (unless otherwise directed):  An IV will be required for this test and Nitroglycerin will be given.   On the Night Before the Test: Be sure to Drink plenty of water. Do not consume any caffeinated/decaffeinated beverages or chocolate 12 hours prior to your test. Do not take any antihistamines 12 hours prior to your test.  On the Day of the Test: Drink plenty of water until 1 hour prior to the test. Do not eat any food 1 hour prior to test. You may take your regular medications prior to the test.  Take  metoprolol (Lopressor) two hours prior to test. If you take Furosemide/Hydrochlorothiazide/Spironolactone/Chlorthalidone, please HOLD on the morning of the test. Patients who wear a continuous glucose monitor MUST remove the device prior to scanning. FEMALES- please wear underwire-free bra if available, avoid dresses & tight clothing       After the Test: Drink plenty of water. After receiving IV contrast, you may experience a mild flushed feeling. This is normal. On occasion, you may experience a mild rash up to 24 hours after the test. This is not dangerous. If this occurs, you can take Benadryl 25 mg, Zyrtec, Claritin, or Allegra and increase your fluid intake. (Patients taking Tikosyn should avoid Benadryl, and may take Zyrtec, Claritin, or Allegra) If you experience trouble breathing, this can be serious. If it is severe call 911 IMMEDIATELY. If it is mild, please call our office.  We will call to schedule your test 2-4 weeks out understanding that some insurance companies will need an authorization prior to the service being performed.   For more information and frequently asked questions, please visit our website : [email or web address]  For non-scheduling related questions, please contact the cardiac imaging nurse navigator should you have any questions/concerns: Cardiac Imaging Nurse Navigators Direct Office Dial: [phone]   For scheduling needs, including cancellations and rescheduling, please call [place], [phone].    Follow-Up: At [facility], you and your health needs are our priority.  As part of our continuing mission to provide you with exceptional heart care, we have created designated  Provider Care Teams.  These Care Teams include your primary Cardiologist (physician) and Advanced Practice Providers (APPs -  Physician Assistants and Nurse Practitioners) who all work together to provide you with the care you need, when you need it.   Your  next appointment:   16 week(s)  Provider:   Thomasene Ripple, DO

## 2023-03-15 NOTE — Progress Notes (Signed)
 Cardiology Office Note:    Date:  03/15/2023   ID:  Bailey Lewis, DOB 05/28/1960, MRN [id number]  PCP:  Novella Olive, FNP  Cardiologist:  Thomasene Ripple, DO  Electrophysiologist:  None   Referring MD: Richardean Chimera, MD   " I am having chest pain"   History of Present Illness:    Bailey Lewis is a 63 y.o. female with a hx of coronary calcification seen on Chest CT,  an ex-smoker, presents with occasional chest pain, which she experiences mostly when lying in bed.   She reports that she has been experiencing intermittent chest discomfort. The patient has been exercising regularly since February, walking for 40 minutes every morning and additional walking at home. She has had four full-term pregnancies with no complications or gestational diabetes. She has not been on any medication for heart disease. A recent chest x-ray showed pacification, prompting her gynecologist to refer her to a cardiologist.  Past Medical History:  Diagnosis Date   Anal fissure    Anemia    Bowel obstruction (HCC)    Colon polyps    Crohn disease (HCC)    Enteritis    small and large intestine   Fracture of vertebra, thoracic (HCC)    Gallstones    Kidney stones    Renal calculus, left    nonobstructive    Past Surgical History:  Procedure Laterality Date   Abdominal Drain tube Placed     CHOLECYSTECTOMY     LITHOTRIPSY     SMALL INTESTINE SURGERY     with appendectomy   TONSILLECTOMY      Current Medications: Current Meds  Medication Sig   metoprolol tartrate (LOPRESSOR) 100 MG tablet Take 2 hours prior to CT   mupirocin ointment (BACTROBAN) 2 % Apply to affected area 2 (two) times daily inside nostrils   naproxen sodium (ALEVE) 220 MG tablet Take 220 mg by mouth daily as needed.     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   Occupation: medical receptionist  Tobacco Use   Smoking status: Former    Current packs/day: 0.00    Average packs/day: 0.3 packs/day for 40.0 years (10.0 ttl pk-yrs)    Types: Cigarettes    Start date: 24    Quit date: 2019    Years since quitting: 6.1   Smokeless tobacco: Never  Vaping Use   Vaping status: Never Used  Substance and Sexual Activity   Alcohol use: Yes    Alcohol/week: 0.0 standard drinks of alcohol    Comment: occasional wine or beer   Drug use: No   Sexual activity: Yes    Partners: Male  Other Topics Concern   Not on file  Social History Narrative   Divorced, has boyfriend   1 son 3 daughtesr - 1984, 75, 45 and 2000   2 caffeine/day   06/23/15   Social Drivers of Corporate investment banker Strain: Not on file  Food Insecurity: Not on file  Transportation Needs: Not on file  Physical Activity: Not on file  Stress: Not on file  Social Connections: Unknown (05/22/2021)   [facility], [facility]   Social Network  Social Network: Not on file     Family History: The patient's family history includes Celiac disease in her cousin; Colon cancer in her maternal grandfather; Colon polyps in her father; Crohn's disease in her cousin; Diabetes in her maternal grandmother and paternal grandmother. There is no history of Breast cancer, Esophageal cancer, or Stomach cancer.  ROS:   Review of Systems  Constitution: Negative for decreased appetite, fever and weight gain.  HENT: Negative for congestion, ear discharge, hoarse voice and sore throat.   Eyes: Negative for discharge, redness, vision loss in right eye and visual halos.  Cardiovascular: Reports chest pain. Negative for , dyspnea on exertion, leg swelling, orthopnea and palpitations.  Respiratory: Negative for cough, hemoptysis, shortness of breath and snoring.   Endocrine: Negative  for heat intolerance and polyphagia.  Hematologic/Lymphatic: Negative for bleeding problem. Does not bruise/bleed easily.  Skin: Negative for flushing, nail changes, rash and suspicious lesions.  Musculoskeletal: Negative for arthritis, joint pain, muscle cramps, myalgias, neck pain and stiffness.  Gastrointestinal: Negative for abdominal pain, bowel incontinence, diarrhea and excessive appetite.  Genitourinary: Negative for decreased libido, genital sores and incomplete emptying.  Neurological: Negative for brief paralysis, focal weakness, headaches and loss of balance.  Psychiatric/Behavioral: Negative for altered mental status, depression and suicidal ideas.  Allergic/Immunologic: Negative for HIV exposure and persistent infections.    EKGs/Labs/Other Studies Reviewed:    The following studies were reviewed today:   EKG:  The ekg ordered today demonstrates   Recent Labs: 04/03/2022: ALT 11; BUN 11; Creatinine, Ser 0.64; Hemoglobin 14.1; Platelets 161; Potassium 4.1; Sodium 137 06/25/2022: TSH 2.210  Recent Lipid Panel    Component Value Date/Time   CHOL 168 06/25/2022 0801   TRIG 98 06/25/2022 0801   HDL 91 06/25/2022 0801   CHOLHDL 1.8 06/25/2022 0801   LDLCALC 60 06/25/2022 0801    Physical Exam:    VS:  BP 118/80 (BP Location: Left Arm, Patient Position: Sitting, Cuff Size: Normal)   Pulse 78   Ht 5\' 6"  (1.676 m)   Wt 195 lb 1.6 oz (88.5 kg)   BMI 31.49 kg/m     Wt Readings from Last 3 Encounters:  03/15/23 195 lb 1.6 oz (88.5 kg)  06/22/22 184 lb 11.2 oz (83.8 kg)  06/01/22 183 lb (83 kg)     GEN: Well nourished, well developed in no acute distress HEENT: Normal NECK: No JVD; No carotid bruits LYMPHATICS: No lymphadenopathy CARDIAC: S1S2 noted,RRR, no murmurs, rubs, gallops RESPIRATORY:  Clear to auscultation without rales, wheezing or rhonchi  ABDOMEN: Soft, non-tender, non-distended, +bowel sounds, no guarding. EXTREMITIES: No edema, No cyanosis, no  clubbing MUSCULOSKELETAL:  No deformity  SKIN: Warm and dry NEUROLOGIC:  Alert and oriented x 3, non-focal PSYCHIATRIC:  Normal affect, good insight  ASSESSMENT:    1. Encounter to establish care   2. Precordial pain   3. Pre-procedure lab exam   4. ASVD (arteriosclerotic vascular disease)   5. Coronary artery calcification seen on CT scan   6. Former smoker   7. Nonspecific abnormal electrocardiogram (ECG) (EKG)    PLAN:    Coronary Artery Disease Coronary calcifications on CT indicate plaque buildup. Ex-smoker status increases risk. . EKG shows historical stress, suggesting previous inadequate blood flow. Intermittent chest pain is atypical but concerning given calcifications. - Order coronary CT angiography to assess coronary arteries and quantify calcification. - Perform blood test for lipoprotein A to evaluate for familial hyperlipidemia. - Consider low-dose statin therapy if significant soft plaques  are found or calcification score is high. - Schedule follow-up to review test results and adjust treatment.    The patient is in agreement with the above plan. The patient left the office in stable condition.  The patient will follow up in   Medication Adjustments/Labs and Tests Ordered: Current medicines are reviewed at length with the patient today.  Concerns regarding medicines are outlined above.  Orders Placed This Encounter  Procedures   CT CORONARY MORPH W/CTA COR W/SCORE W/CA W/CM &/OR WO/CM   Magnesium   Lipoprotein A (LPA)   Comprehensive Metabolic Panel (CMET)   EKG 12-Lead   Meds ordered this encounter  Medications   metoprolol tartrate (LOPRESSOR) 100 MG tablet    Sig: Take 2 hours prior to CT    Dispense:  1 tablet    Refill:  0    Patient Instructions  Medication Instructions:  Your physician recommends that you continue on your current medications as directed. Please refer to the Current Medication list given to you today.  *If you need a refill on  your cardiac medications before your next appointment, please call your pharmacy*   Lab Work: Lp(a), CMET, Mag If you have labs (blood work) drawn today and your tests are completely normal, you will receive your results only by: MyChart Message (if you have MyChart) OR A paper copy in the mail If you have any lab test that is abnormal or we need to change your treatment, we will call you to review the results.   Testing/Procedures:   Your cardiac CT will be scheduled at one of the below locations:   [facility] [address] [place], [place] [address] [phone]   If scheduled at [facility], please arrive at the [facility] and Children's Entrance (Entrance C2) of [facility] 30 minutes prior to test start time. You can use the FREE valet parking offered at entrance C (encouraged to control the heart rate for the test)  Proceed to the [facility] Radiology Department (first floor) to check-in and test prep.  All radiology patients and guests should use entrance C2 at [facility], accessed from [facility], even though the hospital's physical address listed is [address]    Please follow these instructions carefully (unless otherwise directed):  An IV will be required for this test and Nitroglycerin will be given.   On the Night Before the Test: Be sure to Drink plenty of water. Do not consume any caffeinated/decaffeinated beverages or chocolate 12 hours prior to your test. Do not take any antihistamines 12 hours prior to your test.  On the Day of the Test: Drink plenty of water until 1 hour prior to the test. Do not eat any food 1 hour prior to test. You may take your regular medications prior to the test.  Take metoprolol (Lopressor) two hours prior to test. If you take Furosemide/Hydrochlorothiazide/Spironolactone/Chlorthalidone, please HOLD on the morning of the test. Patients who wear a continuous glucose  monitor MUST remove the device prior to scanning. FEMALES- please wear underwire-free bra if available, avoid dresses & tight clothing       After the Test: Drink plenty of water. After receiving IV contrast, you may experience a mild flushed feeling. This is normal. On occasion, you may experience a mild rash up to 24 hours after the test. This is not dangerous. If this occurs, you can take Benadryl 25 mg, Zyrtec, Claritin, or Allegra and increase your fluid  intake. (Patients taking Tikosyn should avoid Benadryl, and may take Zyrtec, Claritin, or Allegra) If you experience trouble breathing, this can be serious. If it is severe call 911 IMMEDIATELY. If it is mild, please call our office.  We will call to schedule your test 2-4 weeks out understanding that some insurance companies will need an authorization prior to the service being performed.   For more information and frequently asked questions, please visit our website : [email or web address]  For non-scheduling related questions, please contact the cardiac imaging nurse navigator should you have any questions/concerns: Cardiac Imaging Nurse Navigators Direct Office Dial: [phone]   For scheduling needs, including cancellations and rescheduling, please call [place], [phone].    Follow-Up: At [facility], you and your health needs are our priority.  As part of our continuing mission to provide you with exceptional heart care, we have created designated Provider Care Teams.  These Care Teams include your primary Cardiologist (physician) and Advanced Practice Providers (APPs -  Physician Assistants and Nurse Practitioners) who all work together to provide you with the care you need, when you need it.   Your next appointment:   16 week(s)  Provider:   Thomasene Ripple, DO    Adopting a Healthy Lifestyle.  Know what a healthy weight is for you (roughly BMI <25) and aim to maintain this   Aim for 7+  servings of fruits and vegetables daily   65-80+ fluid ounces of water or unsweet tea for healthy kidneys   Limit to max 1 drink of alcohol per day; avoid smoking/tobacco   Limit animal fats in diet for cholesterol and heart health - choose grass fed whenever available   Avoid highly processed foods, and foods high in saturated/trans fats   Aim for low stress - take time to unwind and care for your mental health   Aim for 150 min of moderate intensity exercise weekly for heart health, and weights twice weekly for bone health   Aim for 7-9 hours of sleep daily   When it comes to diets, agreement about the perfect plan isnt easy to find, even among the experts. Experts at the [facility] [employer] developed an idea known as the Healthy Eating Plate. Just imagine a plate divided into logical, healthy portions.   The emphasis is on diet quality:   Load up on vegetables and fruits - one-half of your plate: Aim for color and variety, and remember that potatoes dont count.   Go for whole grains - one-quarter of your plate: Whole wheat, barley, wheat berries, quinoa, oats, brown rice, and foods made with them. If you want pasta, go with whole wheat pasta.   Protein power - one-quarter of your plate: Fish, chicken, beans, and nuts are all healthy, versatile protein sources. Limit red meat.   The diet, however, does go beyond the plate, offering a few other suggestions.   Use healthy plant oils, such as olive, canola, soy, corn, sunflower and peanut. Check the labels, and avoid partially hydrogenated oil, which have unhealthy trans fats.   If youre thirsty, drink water. Coffee and tea are good in moderation, but skip sugary drinks and limit milk and dairy products to one or two daily servings.   The type of carbohydrate in the diet is more important than the amount. Some sources of carbohydrates, such as vegetables, fruits, whole grains, and beans-are healthier than others.    Finally, stay active  Signed, Thomasene Ripple, DO  03/15/2023 11:54 PM  [facility]

## 2023-03-26 ENCOUNTER — Other Ambulatory Visit (HOSPITAL_COMMUNITY): Payer: Self-pay

## 2023-04-05 ENCOUNTER — Ambulatory Visit (HOSPITAL_COMMUNITY)

## 2023-06-21 ENCOUNTER — Ambulatory Visit: Admitting: Cardiology

## 2023-08-22 ENCOUNTER — Ambulatory Visit
Admission: RE | Admit: 2023-08-22 | Discharge: 2023-08-22 | Disposition: A | Discharge status: Home / Self Care | Source: Ambulatory Visit | Attending: Family Medicine | Admitting: Family Medicine

## 2023-08-22 ENCOUNTER — Other Ambulatory Visit: Payer: Self-pay | Admitting: Family Medicine

## 2023-08-22 DIAGNOSIS — Z1231 Encounter for screening mammogram for malignant neoplasm of breast: Secondary | ICD-10-CM

## 2023-08-26 ENCOUNTER — Ambulatory Visit: Payer: Self-pay | Admitting: Family Medicine

## 2023-08-30 ENCOUNTER — Ambulatory Visit (INDEPENDENT_AMBULATORY_CARE_PROVIDER_SITE_OTHER): Admitting: Family Medicine

## 2023-08-30 ENCOUNTER — Encounter: Payer: Self-pay | Admitting: Family Medicine

## 2023-08-30 VITALS — BP 153/83 | HR 76 | Temp 98.6°F | Ht 66.0 in | Wt 170.0 lb

## 2023-08-30 DIAGNOSIS — Z0000 Encounter for general adult medical examination without abnormal findings: Secondary | ICD-10-CM | POA: Diagnosis not present

## 2023-08-30 DIAGNOSIS — R030 Elevated blood-pressure reading, without diagnosis of hypertension: Secondary | ICD-10-CM | POA: Diagnosis not present

## 2023-08-30 NOTE — Progress Notes (Signed)
 Complete physical exam  Patient: Bailey Lewis   DOB: [birth year]   63 y.o. Female  MRN: [id number]  Subjective:    Chief Complaint  Patient presents with   Annual Exam    Bailey Lewis is a 63 y.o. female who presents today for a complete physical exam. She reports consuming a general diet. Just exercising  She generally feels well. She reports sleeping poorly. She does not have additional problems to discuss today.      Most recent fall risk assessment:    03/13/2022    4:46 PM  Fall Risk   Falls in the past year? 1  Number falls in past yr: 0  Injury with Fall? 1  Risk for fall due to : History of fall(s);Impaired mobility  Follow up Falls evaluation completed     Most recent depression screenings:    08/30/2023    8:31 AM 03/13/2022    4:47 PM  PHQ 2/9 Scores  PHQ - 2 Score  1  PHQ- 9 Score  8  Exception Documentation Patient refusal     Vision:Within last year and Dental: No current dental problems and Receives regular dental care    Patient Care Team: Booker Darice SAUNDERS, FNP as PCP - General (Family Medicine) Tobb, Kardie, DO as PCP - Cardiology (Cardiology)   Outpatient Medications Prior to Visit  Medication Sig   [DISCONTINUED] Cholecalciferol (VITAMIN D3) 25 MCG (1000 UT) tablet Take 1 tablet by mouth daily. (Patient not taking: Reported on 06/22/2022)   [DISCONTINUED] metoprolol  tartrate (LOPRESSOR ) 100 MG tablet Take 2 hours prior to CT   [DISCONTINUED] mupirocin  ointment (BACTROBAN ) 2 % Apply to affected area 2 (two) times daily inside nostrils   [DISCONTINUED] naproxen sodium (ALEVE) 220 MG tablet Take 220 mg by mouth daily as needed.   [DISCONTINUED] NON FORMULARY Vitamin for hair and nail (Patient not taking: Reported on 06/22/2022)   No facility-administered medications prior to visit.    ROS        Objective:     BP (!) 153/83 (BP Location: Left Arm, Patient Position: Sitting, Cuff Size: Normal)   Pulse 76   Temp 98.6 F (37 C) (Oral)    Ht 5' 6 (1.676 m)   Wt 170 lb (77.1 kg)   SpO2 97%   BMI 27.44 kg/m    Physical Exam Vitals and nursing note reviewed.  Constitutional:      General: She is not in acute distress.    Appearance: Normal appearance.  HENT:     Right Ear: Tympanic membrane normal.     Left Ear: Tympanic membrane normal.     Nose: Nose normal.     Mouth/Throat:     Mouth: Mucous membranes are moist.     Pharynx: Oropharynx is clear.  Eyes:     Extraocular Movements: Extraocular movements intact.  Neck:     Thyroid : No thyroid  tenderness.  Cardiovascular:     Rate and Rhythm: Normal rate and regular rhythm.     Pulses:          Radial pulses are 2+ on the right side and 2+ on the left side.     Heart sounds: Normal heart sounds, S1 normal and S2 normal.  Pulmonary:     Effort: Pulmonary effort is normal.     Breath sounds: Normal breath sounds.  Abdominal:     General: Bowel sounds are normal.     Palpations: Abdomen is soft.     Tenderness: There is abdominal  tenderness (chronic).  Musculoskeletal:        General: Normal range of motion.     Cervical back: Normal range of motion.     Right lower leg: No edema.     Left lower leg: No edema.  Lymphadenopathy:     Cervical:     Right cervical: No superficial cervical adenopathy.    Left cervical: No superficial cervical adenopathy.  Skin:    General: Skin is warm and dry.  Neurological:     General: No focal deficit present.     Mental Status: She is alert. Mental status is at baseline.  Psychiatric:        Mood and Affect: Mood normal.        Behavior: Behavior normal.        Thought Content: Thought content normal.        Judgment: Judgment normal.      No results found for any visits on 08/30/23.     Assessment & Plan:    Routine Health Maintenance and Physical Exam  Immunization History  Administered Date(s) Administered   Influenza-Unspecified 10/23/2021   PFIZER(Purple Top)SARS-COV-2 Vaccination 03/26/2019,  04/17/2019, 11/19/2019    Health Maintenance  Topic Date Due   DTaP/Tdap/Td (1 - Tdap) Never done   Pneumococcal Vaccine: 50+ Years (1 of 2 - PCV) Never done   Colonoscopy  Never done   Zoster Vaccines- Shingrix (1 of 2) Never done   COVID-19 Vaccine (4 - 2024-25 season) 09/09/2022   INFLUENZA VACCINE  08/09/2023   Cervical Cancer Screening (HPV/Pap Cotest)  07/26/2024   MAMMOGRAM  08/21/2025   Hepatitis B Vaccines 19-59 Average Risk  Aged Out   HPV VACCINES  Aged Out   Meningococcal B Vaccine  Aged Out   Hepatitis C Screening  Discontinued   HIV Screening  Discontinued    Discussed health benefits of physical activity, and encouraged her to engage in regular exercise appropriate for her age and condition.  Problem List Items Addressed This Visit     Screening for colon cancer - Primary   Elevated blood pressure reading in office without diagnosis of hypertension   Blood pressure elevated.  Upset during visit as this would likely elevate blood pressure. Unable to recheck.        Routine labs declined.   Recommend healthy diet.  Recommend approximately 150 minutes/week of moderate intensity exercise. Resistance training is good for building muscles and for bone health. Muscle mass helps to increase our metabolism and to burn more calories at rest.  Limit alcohol consumption: no more than one drink per day for women and 2 drinks per day for me. Recommend regular dental and vision exams. Always use seatbelt/lap and shoulder restraints. Recommend using smoke alarms and checking batteries at least twice a year. Recommend using sunscreen when outside. Agrees with plan of care discussed.  Questions answered.      Return in about 1 year (around 08/31/2024) for CPE with labs.     Darice JONELLE Brownie, FNP

## 2023-08-30 NOTE — Assessment & Plan Note (Signed)
 Blood pressure elevated.  Upset during visit as this would likely elevate blood pressure. Unable to recheck.

## 2024-03-02 IMAGING — MG MM DIGITAL SCREENING BILAT W/ TOMO AND CAD
8 series · 9 of 24 positions shown · non-contrast
Comparison: Previous exam(s).

CLINICAL DATA: Screening.

EXAM:
DIGITAL SCREENING BILATERAL MAMMOGRAM WITH TOMOSYNTHESIS AND CAD
TECHNIQUE: Bilateral screening digital craniocaudal and mediolateral oblique
mammograms were obtained. Bilateral screening digital breast
tomosynthesis was performed. The images were evaluated with
computer-aided detection.

[R MLO synth-2D]
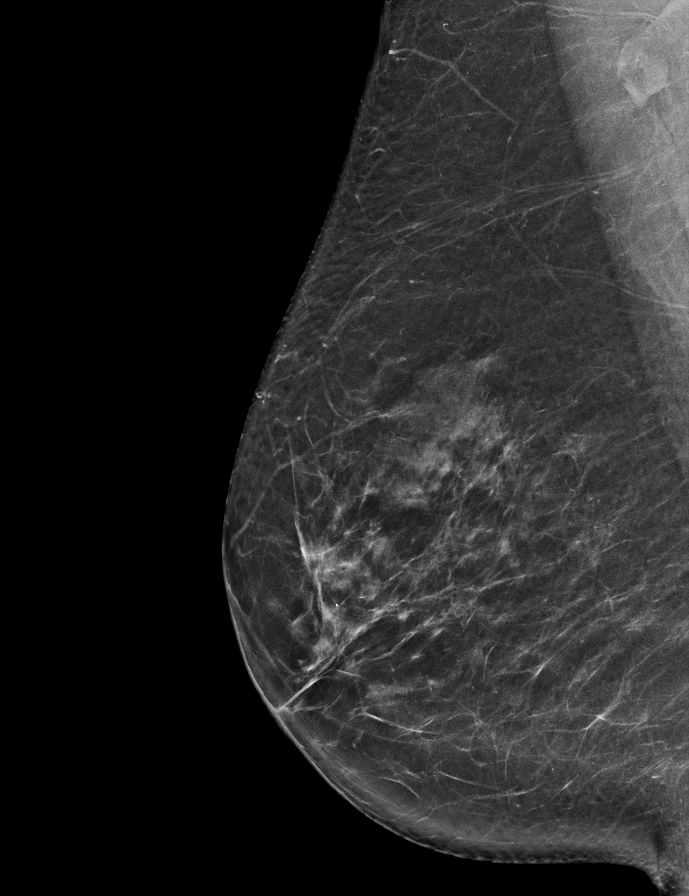

[L MLO synth-2D]
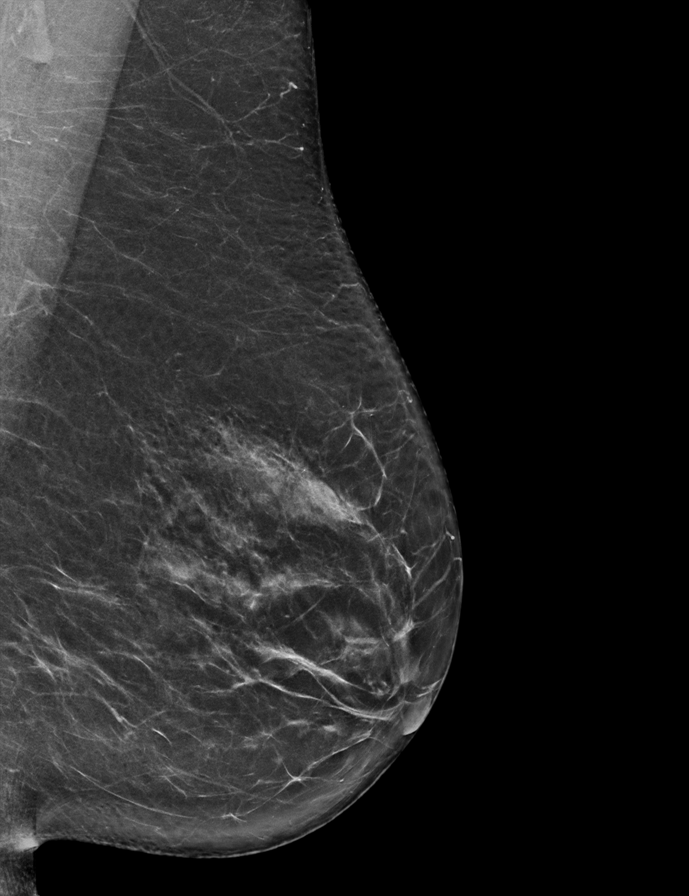

[R CC synth-2D]
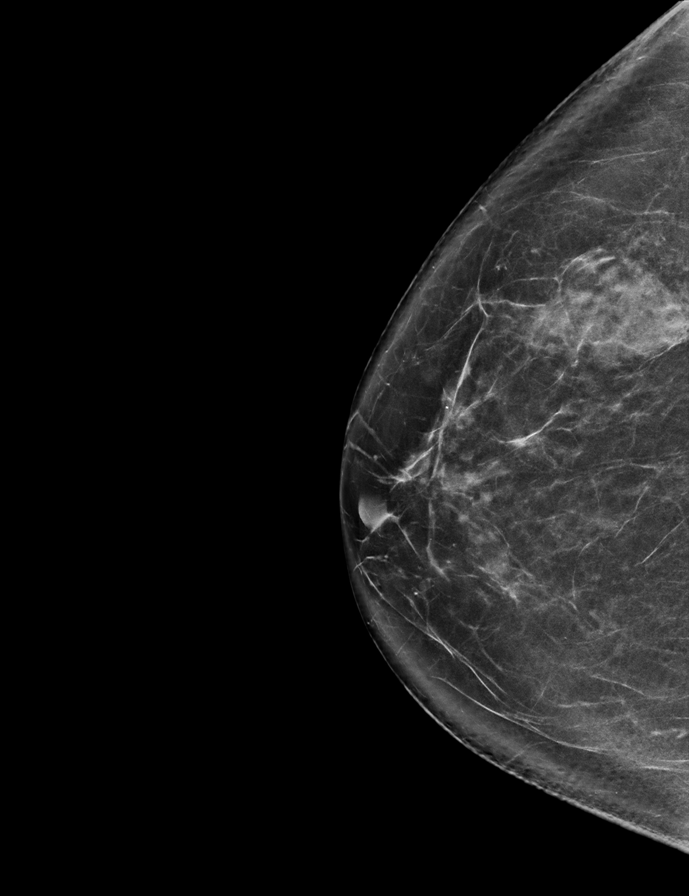

[L CC synth-2D]
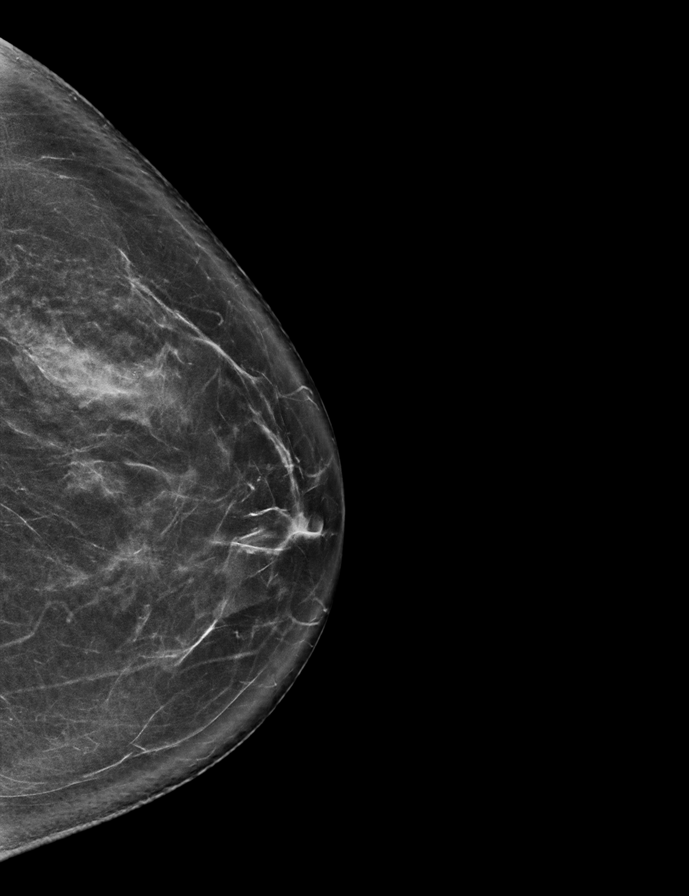

[L MLO tomo · 2 of 76 frames shown]
[frame 25/76]
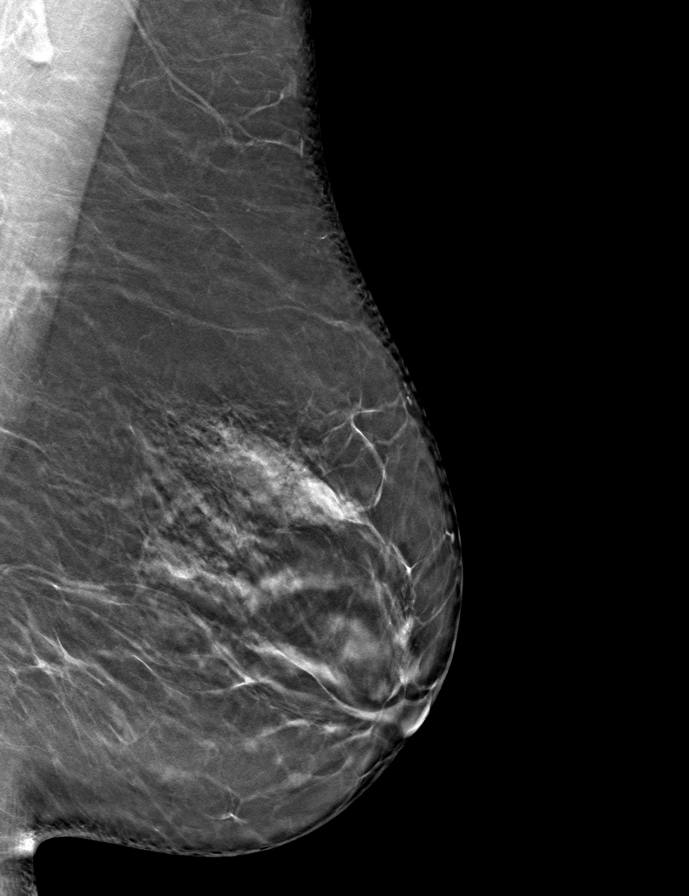
[frame 39/76]
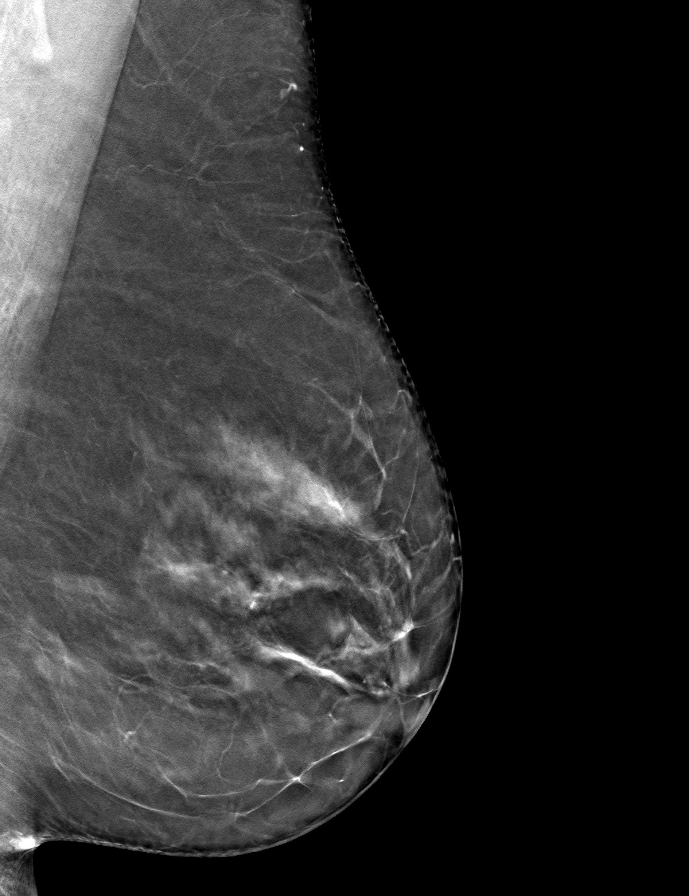

[R MLO tomo · tomo slice 38/75.0]
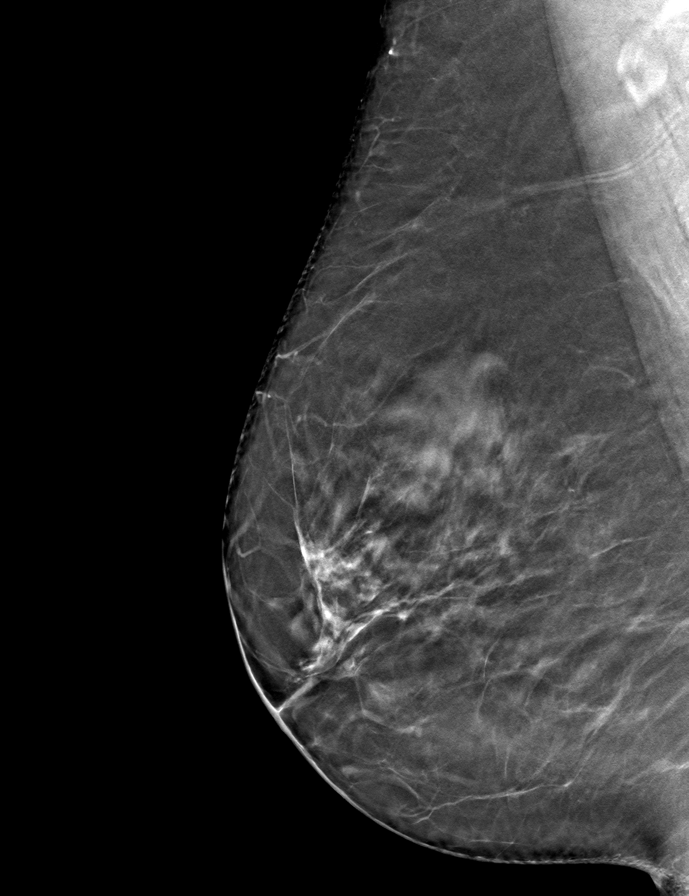

[L CC tomo · tomo slice 37/73.0]
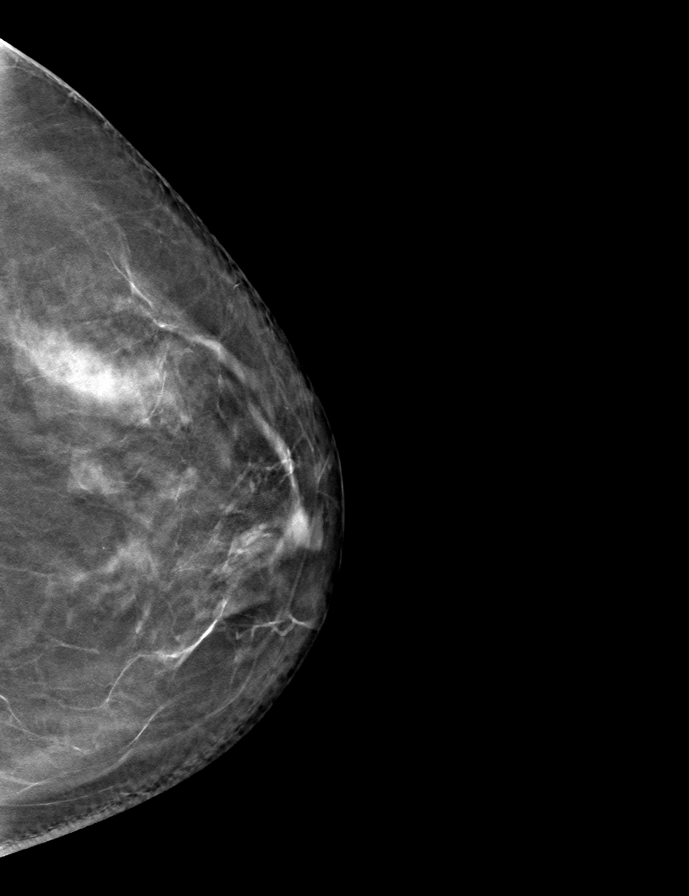

[R CC tomo · tomo slice 38/75.0]
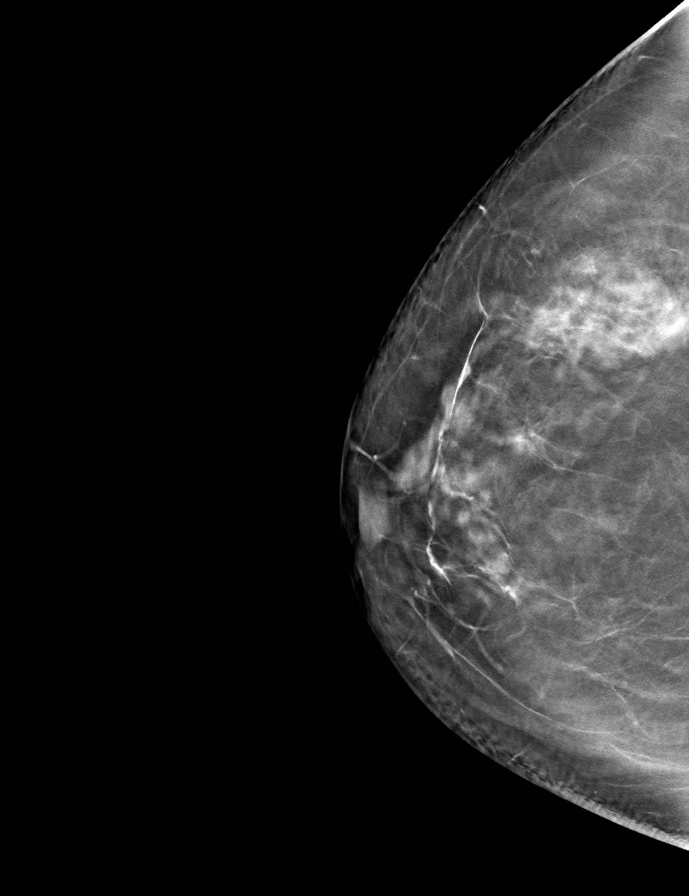

[9 of 24 positions shown; findings below may reference images not displayed]

ACR Breast Density Category b: There are scattered areas of
fibroglandular density.
FINDINGS: There are no findings suspicious for malignancy.
IMPRESSION: No mammographic evidence of malignancy. A result letter of this
screening mammogram will be mailed directly to the patient.

RECOMMENDATION:
Screening mammogram in one year. (Code:51-O-LD2)

BI-RADS CATEGORY  1: Negative.
